# Patient Record
Sex: Female | Born: 1970 | Race: Black or African American | Hispanic: No | Marital: Single | Smoking: Current some day smoker
Health system: Southern US, Community
[De-identification: ages and names within clinical notes are randomized; demographics above are authoritative.]

## PROBLEM LIST (undated history)

## (undated) DIAGNOSIS — F419 Anxiety disorder, unspecified: Secondary | ICD-10-CM

## (undated) DIAGNOSIS — D649 Anemia, unspecified: Secondary | ICD-10-CM

## (undated) DIAGNOSIS — R001 Bradycardia, unspecified: Secondary | ICD-10-CM

## (undated) DIAGNOSIS — J0120 Acute ethmoidal sinusitis, unspecified: Secondary | ICD-10-CM

## (undated) DIAGNOSIS — R0600 Dyspnea, unspecified: Secondary | ICD-10-CM

## (undated) DIAGNOSIS — E079 Disorder of thyroid, unspecified: Secondary | ICD-10-CM

## (undated) DIAGNOSIS — E041 Nontoxic single thyroid nodule: Secondary | ICD-10-CM

## (undated) HISTORY — DX: Anemia, unspecified: D64.9

## (undated) HISTORY — DX: Nontoxic single thyroid nodule: E04.1

## (undated) HISTORY — DX: Bradycardia, unspecified: R00.1

## (undated) HISTORY — DX: Disorder of thyroid, unspecified: E07.9

## (undated) HISTORY — DX: Dyspnea, unspecified: R06.00

---

## 1997-05-31 ENCOUNTER — Inpatient Hospital Stay (HOSPITAL_COMMUNITY): Admission: AD | Admit: 1997-05-31 | Discharge: 1997-06-05 | Payer: Self-pay | Admitting: Obstetrics & Gynecology

## 1997-07-23 ENCOUNTER — Emergency Department (HOSPITAL_COMMUNITY): Admission: EM | Admit: 1997-07-23 | Discharge: 1997-07-23 | Payer: Self-pay | Admitting: Emergency Medicine

## 1997-07-23 ENCOUNTER — Inpatient Hospital Stay (HOSPITAL_COMMUNITY): Admission: AD | Admit: 1997-07-23 | Discharge: 1997-07-29 | Payer: Self-pay | Admitting: Obstetrics & Gynecology

## 1997-08-12 ENCOUNTER — Inpatient Hospital Stay (HOSPITAL_COMMUNITY): Admission: AD | Admit: 1997-08-12 | Discharge: 1997-08-12 | Payer: Self-pay | Admitting: Obstetrics & Gynecology

## 1998-07-05 ENCOUNTER — Ambulatory Visit (HOSPITAL_COMMUNITY): Admission: AD | Admit: 1998-07-05 | Discharge: 1998-07-05 | Payer: Self-pay | Admitting: *Deleted

## 1998-07-05 ENCOUNTER — Encounter: Payer: Self-pay | Admitting: *Deleted

## 2001-06-10 ENCOUNTER — Emergency Department (HOSPITAL_COMMUNITY): Admission: EM | Admit: 2001-06-10 | Discharge: 2001-06-10 | Payer: Self-pay | Admitting: Emergency Medicine

## 2001-06-10 ENCOUNTER — Encounter: Payer: Self-pay | Admitting: Emergency Medicine

## 2001-07-06 ENCOUNTER — Ambulatory Visit (HOSPITAL_COMMUNITY): Admission: RE | Admit: 2001-07-06 | Discharge: 2001-07-06 | Payer: Self-pay | Admitting: Unknown Physician Specialty

## 2001-07-06 ENCOUNTER — Encounter: Payer: Self-pay | Admitting: Unknown Physician Specialty

## 2001-11-12 ENCOUNTER — Encounter: Admission: RE | Admit: 2001-11-12 | Discharge: 2001-11-12 | Payer: Self-pay | Admitting: Oncology

## 2001-11-12 ENCOUNTER — Encounter (HOSPITAL_COMMUNITY): Admission: RE | Admit: 2001-11-12 | Discharge: 2001-12-12 | Payer: Self-pay | Admitting: Oncology

## 2001-11-13 ENCOUNTER — Encounter (HOSPITAL_COMMUNITY): Payer: Self-pay | Admitting: Oncology

## 2002-08-25 ENCOUNTER — Ambulatory Visit (HOSPITAL_COMMUNITY): Admission: RE | Admit: 2002-08-25 | Discharge: 2002-08-25 | Payer: Self-pay | Admitting: Specialist

## 2002-08-25 ENCOUNTER — Encounter: Payer: Self-pay | Admitting: Specialist

## 2004-10-15 ENCOUNTER — Emergency Department (HOSPITAL_COMMUNITY): Admission: EM | Admit: 2004-10-15 | Discharge: 2004-10-15 | Payer: Self-pay | Admitting: Emergency Medicine

## 2005-08-03 ENCOUNTER — Ambulatory Visit: Payer: Self-pay | Admitting: Internal Medicine

## 2005-10-08 ENCOUNTER — Ambulatory Visit: Payer: Self-pay | Admitting: Internal Medicine

## 2006-03-19 ENCOUNTER — Emergency Department (HOSPITAL_COMMUNITY): Admission: EM | Admit: 2006-03-19 | Discharge: 2006-03-19 | Payer: Self-pay | Admitting: Emergency Medicine

## 2010-03-05 DIAGNOSIS — R0600 Dyspnea, unspecified: Secondary | ICD-10-CM

## 2010-03-05 DIAGNOSIS — R001 Bradycardia, unspecified: Secondary | ICD-10-CM

## 2010-03-05 DIAGNOSIS — E041 Nontoxic single thyroid nodule: Secondary | ICD-10-CM

## 2010-03-05 HISTORY — DX: Bradycardia, unspecified: R00.1

## 2010-03-05 HISTORY — DX: Dyspnea, unspecified: R06.00

## 2010-03-05 HISTORY — DX: Nontoxic single thyroid nodule: E04.1

## 2010-03-26 ENCOUNTER — Encounter: Payer: Self-pay | Admitting: Family Medicine

## 2011-01-07 ENCOUNTER — Other Ambulatory Visit: Payer: Self-pay

## 2011-01-07 ENCOUNTER — Emergency Department (HOSPITAL_COMMUNITY): Payer: Self-pay

## 2011-01-07 ENCOUNTER — Encounter: Payer: Self-pay | Admitting: *Deleted

## 2011-01-07 ENCOUNTER — Emergency Department (HOSPITAL_COMMUNITY)
Admission: EM | Admit: 2011-01-07 | Discharge: 2011-01-08 | Disposition: A | Discharge status: Home / Self Care | Payer: Self-pay | Attending: Emergency Medicine | Admitting: Emergency Medicine

## 2011-01-07 DIAGNOSIS — R0602 Shortness of breath: Secondary | ICD-10-CM | POA: Insufficient documentation

## 2011-01-07 DIAGNOSIS — R070 Pain in throat: Secondary | ICD-10-CM | POA: Insufficient documentation

## 2011-01-07 DIAGNOSIS — R0789 Other chest pain: Secondary | ICD-10-CM | POA: Insufficient documentation

## 2011-01-07 DIAGNOSIS — R1310 Dysphagia, unspecified: Secondary | ICD-10-CM | POA: Insufficient documentation

## 2011-01-07 DIAGNOSIS — R0600 Dyspnea, unspecified: Secondary | ICD-10-CM

## 2011-01-07 DIAGNOSIS — R0609 Other forms of dyspnea: Secondary | ICD-10-CM | POA: Insufficient documentation

## 2011-01-07 DIAGNOSIS — M542 Cervicalgia: Secondary | ICD-10-CM | POA: Insufficient documentation

## 2011-01-07 DIAGNOSIS — E049 Nontoxic goiter, unspecified: Secondary | ICD-10-CM | POA: Insufficient documentation

## 2011-01-07 DIAGNOSIS — R0989 Other specified symptoms and signs involving the circulatory and respiratory systems: Secondary | ICD-10-CM | POA: Insufficient documentation

## 2011-01-07 DIAGNOSIS — E041 Nontoxic single thyroid nodule: Secondary | ICD-10-CM | POA: Insufficient documentation

## 2011-01-07 LAB — BASIC METABOLIC PANEL
CO2: 23 mEq/L (ref 19–32)
Calcium: 9.4 mg/dL (ref 8.4–10.5)
Creatinine, Ser: 0.68 mg/dL (ref 0.50–1.10)
GFR calc Af Amer: >90 mL/min (ref >90)
Sodium: 136 mEq/L (ref 135–145)

## 2011-01-07 LAB — DIFFERENTIAL
Basophils Relative: 0 % (ref 0–1)
Eosinophils Absolute: 0.1 10*3/uL (ref 0.0–0.7)
Eosinophils Relative: 1 % (ref 0–5)
Lymphocytes Relative: 42 % (ref 12–46)
Monocytes Absolute: 0.7 10*3/uL (ref 0.1–1.0)
Neutrophils Relative %: 50 % (ref 43–77)

## 2011-01-07 LAB — RAPID STREP SCREEN (MED CTR MEBANE ONLY): Streptococcus, Group A Screen (Direct): NEGATIVE

## 2011-01-07 LAB — CBC
Hemoglobin: 9.7 g/dL — ABNORMAL LOW (ref 12.0–15.0)
MCH: 22.1 pg — ABNORMAL LOW (ref 26.0–34.0)
Platelets: 283 10*3/uL (ref 150–400)
RBC: 4.39 MIL/uL (ref 3.87–5.11)

## 2011-01-07 LAB — D-DIMER, QUANTITATIVE: D-Dimer, Quant: 0.59 ug/mL-FEU — ABNORMAL HIGH (ref 0.00–0.48)

## 2011-01-07 MED ORDER — AEROCHAMBER PLUS W/MASK MISC
1.0000 | Freq: Once | Status: AC
  Administered 2011-01-07: 1
  Filled 2011-01-07: qty 1

## 2011-01-07 MED ORDER — ALBUTEROL SULFATE HFA 108 (90 BASE) MCG/ACT IN AERS
2.0000 | INHALATION_SPRAY | Freq: Once | RESPIRATORY_TRACT | Status: AC
  Administered 2011-01-07: 2 via RESPIRATORY_TRACT
  Filled 2011-01-07: qty 6.7

## 2011-01-07 MED ORDER — GI COCKTAIL ~~LOC~~
30.0000 mL | Freq: Once | ORAL | Status: AC
  Administered 2011-01-07: 30 mL via ORAL
  Filled 2011-01-07 (×3): qty 30

## 2011-01-07 MED ORDER — NAPROXEN 250 MG PO TABS
500.0000 mg | ORAL_TABLET | Freq: Once | ORAL | Status: AC
  Administered 2011-01-07: 500 mg via ORAL
  Filled 2011-01-07: qty 1
  Filled 2011-01-07 (×2): qty 2

## 2011-01-07 MED ORDER — IOHEXOL 300 MG/ML  SOLN
100.0000 mL | Freq: Once | INTRAMUSCULAR | Status: AC | PRN
  Administered 2011-01-07: 100 mL via INTRAVENOUS

## 2011-01-07 NOTE — ED Notes (Signed)
Reports sob for 2 weeks, denies cough or cp. spo2 100% on room air.

## 2011-01-07 NOTE — ED Provider Notes (Signed)
History     CSN: [id number] Arrival date & time: 01/07/2011  4:55 PM   First MD Initiated Contact with Patient 01/07/11 2026      Chief Complaint  Patient presents with  . Shortness of Breath    (Consider location/radiation/quality/duration/timing/severity/associated sxs/prior treatment) HPI Comments: Patient presents with one month of sore throat and worsening shortness of breath. She describes it as an inability to take a deep breath. She also expresses some chest discomfort. She states the symptoms become progressively worse over the past month and now she has some difficulty swallowing although is able to swallow everything she desires. She has no ill contacts. She has no cough, fever, chills, congestion. She has no primary care physician. Denies abdominal pain, nausea, vomiting. There no additional exacerbating or alleviating measures.  The history is provided by the patient. No language interpreter was used.    History reviewed. No pertinent past medical history.  History reviewed. No pertinent past surgical history.  History reviewed. No pertinent family history.  History  Substance Use Topics  . Smoking status: Current Everyday Smoker  . Smokeless tobacco: Not on file  . Alcohol Use: Yes     occ    OB History    Grav Para Term Preterm Abortions TAB SAB Ect Mult Living                  Review of Systems  Constitutional: Negative for fever, activity change, appetite change and fatigue.  HENT: Positive for sore throat and neck pain. Negative for congestion, rhinorrhea, trouble swallowing and neck stiffness.   Respiratory: Positive for shortness of breath. Negative for cough and chest tightness.   Cardiovascular: Positive for chest pain. Negative for palpitations.  Gastrointestinal: Negative for nausea, vomiting and abdominal pain.  Genitourinary: Negative for dysuria, urgency, frequency and flank pain.  Musculoskeletal: Negative for myalgias and back pain.    Neurological: Negative for dizziness, weakness, light-headedness and headaches.  All other systems reviewed and are negative.    Allergies  Review of patient's allergies indicates no known allergies.  Home Medications   Current Outpatient Rx  Name Route Sig Dispense Refill  . PSEUDOEPH-DOXYLAMINE-DM-APAP 60-7.08-01-998 MG/30ML PO LIQD Oral Take 30 mLs by mouth at bedtime as needed. For cough/congestion     . HYDROCODONE-ACETAMINOPHEN 7.5-500 MG/15ML PO SOLN Oral Take 15 mLs by mouth every 6 (six) hours as needed for pain. 120 mL 0    BP 112/69  Pulse 56  Temp(Src) 99 F (37.2 C) (Oral)  Resp 17  SpO2 95%  LMP 12/27/2010  Physical Exam  Nursing note and vitals reviewed. Constitutional: She is oriented to person, place, and time. She appears well-developed and well-nourished. No distress.  HENT:  Head: Normocephalic and atraumatic.  Mouth/Throat: Oropharynx is clear and moist. No oropharyngeal exudate.  Eyes: Conjunctivae and EOM are normal. Pupils are equal, round, and reactive to light.  Neck: Normal range of motion. Neck supple. Thyromegaly present.  Cardiovascular: Normal rate, regular rhythm, normal heart sounds and intact distal pulses.  Exam reveals no gallop and no friction rub.   No murmur heard. Pulmonary/Chest: Effort normal and breath sounds normal. No respiratory distress. She has no wheezes. She has no rales. She exhibits no tenderness.  Abdominal: Soft. Bowel sounds are normal. There is no tenderness.  Musculoskeletal: Normal range of motion. She exhibits no tenderness.  Neurological: She is alert and oriented to person, place, and time.  Skin: Skin is warm and dry. No rash noted.  ED Course  Procedures (including critical care time)   Date: 01/07/2011  Rate: 56  Rhythm: sinus bradycardia  QRS Axis: normal  Intervals: normal  ST/T Wave abnormalities: normal  Conduction Disutrbances:none  Narrative Interpretation:   Old EKG Reviewed: none  available  Labs Reviewed  CBC - Abnormal; Notable for the following:    Hemoglobin 9.7 (*)    HCT 32.4 (*)    MCV 73.8 (*)    MCH 22.1 (*)    MCHC 29.9 (*)    RDW 18.1 (*)    All other components within normal limits  DIFFERENTIAL - Abnormal; Notable for the following:    Lymphs Abs 4.3 (*)    All other components within normal limits  D-DIMER, QUANTITATIVE - Abnormal; Notable for the following:    D-Dimer, Quant 0.59 (*)    All other components within normal limits  BASIC METABOLIC PANEL  RAPID STREP SCREEN  I-STAT TROPONIN I   Dg Chest 2 View  01/07/2011  *RADIOLOGY REPORT*  Clinical Data:  CHEST - 2 VIEW  Comparison: 01/27/2008  Findings: Upper normal heart size.  Clear lungs.  No pneumothorax or pleural fluid.  IMPRESSION: No active cardiopulmonary disease.  Original Report Authenticated By: Donavan Burnet, M.D.   Ct Angio Chest W/cm &/or Wo Cm  01/07/2011  *RADIOLOGY REPORT*  Clinical Data:  Short of breath  CT ANGIOGRAPHY CHEST WITH CONTRAST  Technique:  Multidetector CT imaging of the chest was performed using the standard protocol during bolus administration of intravenous contrast.  Multiplanar CT image reconstructions including MIPs were obtained to evaluate the vascular anatomy.  Contrast: OMNIPAQUE IOHEXOL 300 MG/ML IV SOLN  Comparison:  None  Findings:  There are no filling defects in the pulmonary arterial tree to suggest acute pulmonary thromboembolism.  2.7 cm left thyroid mass.  Negative abnormal mediastinal adenopathy.  Negative pericardial effusion.  Lungs are clear.  No pneumothorax and no pleural effusion.  Review of the MIP images confirms the above findings.  IMPRESSION: No evidence of acute pulmonary thromboembolism.  2.7 cm left thyroid mass.  Thyroid ultrasound is recommended.  Original Report Authenticated By: Donavan Burnet, M.D.     1. Thyroid nodule   2. Dyspnea       MDM  Patient complained of throat fullness and shortness of breath has been  going on for approximately a month. I performed baseline labs as well as a chest x-ray. She is found to have anemia at 9.7 with no active bleeding. Chest x-ray was negative. She did have a slightly elevated d-dimer therefore I performed a CT angio for chest to rule out pulmonary embolus. There was an incidental finding of a 2.7 cm left thyroid mass. I feel this is likely contributing to her feeling of neck fullness however there is no compression of the airways. I discussed the case with the on-call general surgeon who recommended followup in the clinic this week. She has no insurance and limited followup I will also provide the number for the family practice clinic. Patient will require an outpatient ultrasound however do not feel there is no indication to perform this in the emergency department at this time given no airway involvement. She has lungs that are clear bilaterally with adequate air exchange. I will discharge her home with Lortab elixir as well as an albuterol inhaler.        Dayton Bailiff, MD 01/08/11 [phone]

## 2011-01-07 NOTE — ED Notes (Signed)
Patient taken to CT.

## 2011-01-08 MED ORDER — HYDROCODONE-ACETAMINOPHEN 7.5-500 MG/15ML PO SOLN: 15.0000 mL | Freq: Four times a day (QID) | ORAL | Status: DC | PRN

## 2011-01-11 ENCOUNTER — Emergency Department (HOSPITAL_COMMUNITY)
Admission: EM | Admit: 2011-01-11 | Discharge: 2011-01-11 | Disposition: A | Discharge status: Home Health | Payer: Self-pay | Attending: Emergency Medicine | Admitting: Emergency Medicine

## 2011-01-11 ENCOUNTER — Emergency Department (HOSPITAL_COMMUNITY): Payer: Self-pay

## 2011-01-11 ENCOUNTER — Encounter (HOSPITAL_COMMUNITY): Payer: Self-pay | Admitting: Emergency Medicine

## 2011-01-11 DIAGNOSIS — R0600 Dyspnea, unspecified: Secondary | ICD-10-CM

## 2011-01-11 DIAGNOSIS — R0989 Other specified symptoms and signs involving the circulatory and respiratory systems: Secondary | ICD-10-CM | POA: Insufficient documentation

## 2011-01-11 DIAGNOSIS — R0609 Other forms of dyspnea: Secondary | ICD-10-CM | POA: Insufficient documentation

## 2011-01-11 DIAGNOSIS — R070 Pain in throat: Secondary | ICD-10-CM | POA: Insufficient documentation

## 2011-01-11 DIAGNOSIS — R599 Enlarged lymph nodes, unspecified: Secondary | ICD-10-CM | POA: Insufficient documentation

## 2011-01-11 DIAGNOSIS — R0602 Shortness of breath: Secondary | ICD-10-CM | POA: Insufficient documentation

## 2011-01-11 DIAGNOSIS — E041 Nontoxic single thyroid nodule: Secondary | ICD-10-CM | POA: Insufficient documentation

## 2011-01-11 DIAGNOSIS — R42 Dizziness and giddiness: Secondary | ICD-10-CM | POA: Insufficient documentation

## 2011-01-11 MED ORDER — TRAMADOL HCL 50 MG PO TABS: ORAL_TABLET | ORAL | Status: DC

## 2011-01-11 MED ORDER — IBUPROFEN 100 MG/5ML PO SUSP: 400.0000 mg | Freq: Once | ORAL | Status: DC

## 2011-01-11 MED ORDER — TRAMADOL HCL 50 MG PO TABS
100.0000 mg | ORAL_TABLET | Freq: Once | ORAL | Status: AC
  Administered 2011-01-11: 100 mg via ORAL
  Filled 2011-01-11: qty 1

## 2011-01-11 MED ORDER — ACETAMINOPHEN 325 MG PO TABS
ORAL_TABLET | ORAL | Status: AC
  Filled 2011-01-11: qty 3

## 2011-01-11 MED ORDER — ACETAMINOPHEN 160 MG/5ML PO SOLN
975.0000 mg | Freq: Once | ORAL | Status: AC
  Administered 2011-01-11 (×2): 975 mg via ORAL

## 2011-01-11 MED ORDER — ACETAMINOPHEN 500 MG PO TABS
1000.0000 mg | ORAL_TABLET | Freq: Once | ORAL | Status: DC
  Filled 2011-01-11: qty 2

## 2011-01-11 MED ORDER — ACETAMINOPHEN 160 MG/5ML PO SOLN
ORAL | Status: AC
  Administered 2011-01-11: 975 mg via ORAL
  Filled 2011-01-11: qty 40.6

## 2011-01-11 MED ORDER — ALBUTEROL SULFATE (5 MG/ML) 0.5% IN NEBU
5.0000 mg | INHALATION_SOLUTION | Freq: Once | RESPIRATORY_TRACT | Status: AC
  Administered 2011-01-11: 5 mg via RESPIRATORY_TRACT
  Filled 2011-01-11 (×2): qty 0.5

## 2011-01-11 MED ORDER — IPRATROPIUM BROMIDE 0.02 % IN SOLN
0.5000 mg | Freq: Once | RESPIRATORY_TRACT | Status: AC
  Administered 2011-01-11: 0.5 mg via RESPIRATORY_TRACT
  Filled 2011-01-11: qty 2.5

## 2011-01-11 MED ORDER — ALBUTEROL SULFATE HFA 108 (90 BASE) MCG/ACT IN AERS: 1.0000 | INHALATION_SPRAY | Freq: Four times a day (QID) | RESPIRATORY_TRACT | Status: DC | PRN

## 2011-01-11 MED ORDER — IBUPROFEN 100 MG/5ML PO SUSP
ORAL | Status: AC
  Filled 2011-01-11: qty 30

## 2011-01-11 MED ORDER — IBUPROFEN 100 MG/5ML PO SUSP
600.0000 mg | Freq: Once | ORAL | Status: AC
  Administered 2011-01-11: 600 mg via ORAL

## 2011-01-11 NOTE — ED Provider Notes (Cosign Needed)
History     CSN: [id number] Arrival date & time: 01/11/2011  3:26 PM   First MD Initiated Contact with Patient 01/11/11 1533      Chief Complaint  Patient presents with  . Shortness of Breath    (Consider location/radiation/quality/duration/timing/severity/associated sxs/prior treatment) HPI She relates she started having shortness of breath about a month ago it was intermittent but now it is constant. She states exertion or even talking makes it worse.  She also describes some pain in her throat and points to her thyroid area. She states she feels like she has trouble breathing in and she states sometimes she makes stridorous noises or wheezing. She states sometimes she feels like her heart is racing she also feels like her chest is tight she feels dizzy and lightheaded she denies diarrhea or weight loss. She relates she was seen in the ER on November 4 and was told she had a mass on her thyroid. She has called [facility] and has an appointment for November 15. She denies any prior breathing difficulties.  History reviewed. No pertinent past medical history.  History reviewed. No pertinent past surgical history.  No family history on file. No family history of heart disease or thyroid disease  History  Substance Use Topics  . Smoking status: Current Everyday Smoker  . Smokeless tobacco: Not on file  . Alcohol Use: Yes     occ   patient works in a [employer] as a Location manager she states she's not exposed to chemicals  OB History    Grav Para Term Preterm Abortions TAB SAB Ect Mult Living                  Review of Systems  All other systems reviewed and are negative.    Allergies  Review of patient's allergies indicates no known allergies.  Home Medications   Current Outpatient Rx  Name Route Sig Dispense Refill  . OVER THE COUNTER MEDICATION  [facility] cold medicine     . PSEUDOEPH-DOXYLAMINE-DM-APAP 60-7.08-01-998 MG/30ML PO LIQD Oral Take  30 mLs by mouth at bedtime as needed. For cough/congestion     . ALBUTEROL SULFATE HFA 108 (90 BASE) MCG/ACT IN AERS Inhalation Inhale 1-2 puffs into the lungs every 6 (six) hours as needed for wheezing. 1 Inhaler 0  . TRAMADOL HCL 50 MG PO TABS  Maximum dose= 8 tablets per day 1 ot 2 tabs po QID for pain 15 tablet 0    BP 103/66  Pulse 72  Temp(Src) 97.4 F (36.3 C) (Oral)  Resp 16  SpO2 100%  LMP 12/27/2010  Physical Exam  Vitals reviewed. Constitutional: She is oriented to person, place, and time. She appears well-developed and well-nourished.       Patient has an a accent and sometimes is difficult to understand her speech  HENT:  Head: Normocephalic and atraumatic.  Mouth/Throat: Uvula is midline, oropharynx is clear and moist and mucous membranes are normal.  Eyes: Conjunctivae and EOM are normal. Pupils are equal, round, and reactive to light.  Neck: Normal range of motion. Neck supple. No JVD present. Thyromegaly present.  Cardiovascular: Normal rate, regular rhythm and normal heart sounds.   Pulmonary/Chest: Effort normal and breath sounds normal. No stridor. No respiratory distress. She has no wheezes. She has no rales. She exhibits no tenderness.       Patient's laying flat in bed with no respiratory difficulty seen. She does not appear to be dyspneic on talking. Her pulse  ox remains 100% during her exam on room air  Abdominal: Soft.  Musculoskeletal: Normal range of motion.       No edema noted  Lymphadenopathy:    She has no cervical adenopathy.  Neurological: She is alert and oriented to person, place, and time.  Skin: Skin is warm and dry.  Psychiatric: Her behavior is normal. Thought content normal.       Flat affect    ED Course  Procedures (including critical care time)  16:51 reviewed CT angio of chest from 11/4 with Dr Lowella Dandy, radiology who states her thyroid gland is visualized in it's entirety and her trachea is deviated to the right but there is no airway  compromise seen.   21:15 patient given albuterol and Atrovent nebulizer treatment. She states "it didn't help" however then she then asked me to give her another albuterol inhaler but she was given in the ER several bites ago. She is advised she should not use it so frequently. He is lying flat in bed in no respiratory distress. Her pulse ox is 100% on room air she states she feels like she can go home and she can't breathe. She is reassured that her breathing is okay and that she is feeling the thyroid lying on her trachea however it is not compressing her trachea.   Results for orders placed during the hospital encounter of 01/07/11  CBC      Component Value Range   WBC 10.2  4.0 - 10.5 (K/uL)   RBC 4.39  3.87 - 5.11 (MIL/uL)   Hemoglobin 9.7 (*) 12.0 - 15.0 (g/dL)   HCT 56.2 (*) 13.0 - 46.0 (%)   MCV 73.8 (*) 78.0 - 100.0 (fL)   MCH 22.1 (*) 26.0 - 34.0 (pg)   MCHC 29.9 (*) 30.0 - 36.0 (g/dL)   RDW 86.5 (*) 78.4 - 15.5 (%)   Platelets 283  150 - 400 (K/uL)  DIFFERENTIAL      Component Value Range   Neutrophils Relative 50  43 - 77 (%)   Lymphocytes Relative 42  12 - 46 (%)   Monocytes Relative 7  3 - 12 (%)   Eosinophils Relative 1  0 - 5 (%)   Basophils Relative 0  0 - 1 (%)   Neutro Abs 5.1  1.7 - 7.7 (K/uL)   Lymphs Abs 4.3 (*) 0.7 - 4.0 (K/uL)   Monocytes Absolute 0.7  0.1 - 1.0 (K/uL)   Eosinophils Absolute 0.1  0.0 - 0.7 (K/uL)   Basophils Absolute 0.0  0.0 - 0.1 (K/uL)   RBC Morphology TARGET CELLS     WBC Morphology ATYPICAL LYMPHOCYTES    BASIC METABOLIC PANEL      Component Value Range   Sodium 136  135 - 145 (mEq/L)   Potassium 3.6  3.5 - 5.1 (mEq/L)   Chloride 103  96 - 112 (mEq/L)   CO2 23  19 - 32 (mEq/L)   Glucose, Bld 96  70 - 99 (mg/dL)   BUN 10  6 - 23 (mg/dL)   Creatinine, Ser 6.96  0.50 - 1.10 (mg/dL)   Calcium 9.4  8.4 - 29.5 (mg/dL)   GFR calc non Af Amer >90  >90 (mL/min)   GFR calc Af Amer >90  >90 (mL/min)  RAPID STREP SCREEN      Component  Value Range   Streptococcus, Group A Screen (Direct) NEGATIVE  NEGATIVE   D-DIMER, QUANTITATIVE      Component Value Range  D-Dimer, Quant 0.59 (*) 0.00 - 0.48 (ug/mL-FEU)   Laboratory interpretation labs reviewed from 11/4. Thyroid test were sent  today but are not back.  US Soft Tissue Head/neck  01/11/2011  *RADIOLOGY REPORT*  Clinical Data: Shortness of breath, left thyroid mass  THYROID ULTRASOUND  Technique: Ultrasound examination of the thyroid gland and adjacent soft tissues was performed.  Comparison:  CT 01/07/2011  Findings:  Right thyroid lobe:  20 x 24 x 16 mm, homogeneous background parenchyma Left thyroid lobe:  26 x 30 x 68 mm Isthmus:  9 mm in thickness no  Focal nodules:  2 x 3 x 3 mm cyst, mid-right 24 x 20 x 32 mm complex mostly solid, inferior left 9 x 11 x 12 mm solid, mid-left  Lymphadenopathy:  None visualized.  IMPRESSION:  1.  Dominant 3.2 cm left thyroid lesion.  Recommend outpatient FNA biopsy to exclude neoplasm. This recommendation follows the consensus statement:  Management of Thyroid Nodules Detected as [place]: Society of Radiologists in Ultrasound Consensus Conference Statement.  Radiology 2005; [id number].  Available online at : [email or web address].[email or web address]/[email or web address].  Original Report Authenticated By: Osa Craver, M.D.      Dg Chest 2 View  01/07/2011  *RADIOLOGY REPORT*  Clinical Data:  CHEST - 2 VIEW  Comparison: 01/27/2008  Findings: Upper normal heart size.  Clear lungs.  No pneumothorax or pleural fluid.  IMPRESSION: No active cardiopulmonary disease.  Original Report Authenticated By: Donavan Burnet, M.D.   Ct Angio Chest W/cm &/or Wo Cm  01/07/2011  *RADIOLOGY REPORT*  Clinical Data:  Short of breath  CT ANGIOGRAPHY CHEST WITH CONTRAST  Technique:  Multidetector CT imaging of the chest was performed using the standard protocol during bolus administration of intravenous contrast.  Multiplanar CT image reconstructions including MIPs  were obtained to evaluate the vascular anatomy.  Contrast: OMNIPAQUE IOHEXOL 300 MG/ML IV SOLN  Comparison:  None  Findings:  There are no filling defects in the pulmonary arterial tree to suggest acute pulmonary thromboembolism.  2.7 cm left thyroid mass.  Negative abnormal mediastinal adenopathy.  Negative pericardial effusion.  Lungs are clear.  No pneumothorax and no pleural effusion.  Review of the MIP images confirms the above findings.  IMPRESSION: No evidence of acute pulmonary thromboembolism.  2.7 cm left thyroid mass.  Thyroid ultrasound is recommended.  Original Report Authenticated By: Donavan Burnet, M.D.      Diagnoses that have been ruled out:  Diagnoses that are still under consideration:  Final diagnoses:  Thyroid nodule  Dyspnea    Medications  OVER THE COUNTER MEDICATION (not administered)  ibuprofen (ADVIL,MOTRIN) 100 MG/5ML suspension (not administered)  albuterol (PROVENTIL HFA;VENTOLIN HFA) 108 (90 BASE) MCG/ACT inhaler (not administered)  traMADol (ULTRAM) 50 MG tablet (not administered)  traMADol (ULTRAM) tablet 100 mg (100 mg Oral Given 01/11/11 1700)  albuterol (PROVENTIL) (5 MG/ML) 0.5% nebulizer solution 5 mg (5 mg Nebulization Given 01/11/11 1809)  ipratropium (ATROVENT) nebulizer solution 0.5 mg (0.5 mg Nebulization Given 01/11/11 1810)  acetaminophen (TYLENOL) solution 975 mg (975 mg Oral Given 01/11/11 1845)  ibuprofen (ADVIL,MOTRIN) 100 MG/5ML suspension 600 mg (600 mg Oral Given 01/11/11 2117)   Devoria Albe, MD, FACEP    MDM          Ward Givens, MD 01/11/11 2127

## 2011-01-11 NOTE — ED Notes (Signed)
Pt returns from ultrasound. Pt asks for inhaler.Pt has BBS=Clear with a good air exchange. SAo2 is 100% on room air and rr is 13

## 2011-01-11 NOTE — ED Notes (Signed)
Pt c/o airway obstruction.  SpO2=100% on room air.  Pt does in fact have a palpable thyroid.  No respiratory distress noted.  VSS, pt does have asymptomatic bradycardia.

## 2011-01-11 NOTE — ED Notes (Signed)
Was seen Sunday for same  Not feeling any better sob  Given inhaler for sob

## 2011-01-11 NOTE — ED Notes (Signed)
Pt report given to [facility], RN and care endorsed

## 2011-01-13 ENCOUNTER — Encounter (HOSPITAL_COMMUNITY): Payer: Self-pay | Admitting: *Deleted

## 2011-01-13 ENCOUNTER — Emergency Department (HOSPITAL_COMMUNITY)
Admission: EM | Admit: 2011-01-13 | Discharge: 2011-01-13 | Disposition: A | Discharge status: Home / Self Care | Payer: Self-pay | Attending: Emergency Medicine | Admitting: Emergency Medicine

## 2011-01-13 DIAGNOSIS — E860 Dehydration: Secondary | ICD-10-CM | POA: Insufficient documentation

## 2011-01-13 DIAGNOSIS — R0602 Shortness of breath: Secondary | ICD-10-CM | POA: Insufficient documentation

## 2011-01-13 DIAGNOSIS — F419 Anxiety disorder, unspecified: Secondary | ICD-10-CM

## 2011-01-13 DIAGNOSIS — F411 Generalized anxiety disorder: Secondary | ICD-10-CM | POA: Insufficient documentation

## 2011-01-13 DIAGNOSIS — E079 Disorder of thyroid, unspecified: Secondary | ICD-10-CM | POA: Insufficient documentation

## 2011-01-13 DIAGNOSIS — R42 Dizziness and giddiness: Secondary | ICD-10-CM | POA: Insufficient documentation

## 2011-01-13 LAB — POCT I-STAT, CHEM 8
BUN: 5 mg/dL — ABNORMAL LOW (ref 6–23)
Calcium, Ion: 1.28 mmol/L (ref 1.12–1.32)
Chloride: 104 mEq/L (ref 96–112)
Glucose, Bld: 94 mg/dL (ref 70–99)
TCO2: 23 mmol/L (ref 0–100)

## 2011-01-13 MED ORDER — LORAZEPAM 2 MG/ML IJ SOLN
2.0000 mg | Freq: Once | INTRAMUSCULAR | Status: AC
  Administered 2011-01-13: 2 mg via INTRAVENOUS
  Filled 2011-01-13: qty 1

## 2011-01-13 MED ORDER — SODIUM CHLORIDE 0.9 % IJ SOLN
3.0000 mL | INTRAMUSCULAR | Status: DC | PRN
  Administered 2011-01-13: 3 mL via INTRAVENOUS

## 2011-01-13 MED ORDER — LORAZEPAM 1 MG PO TABS: 1.0000 mg | ORAL_TABLET | Freq: Four times a day (QID) | ORAL | Status: DC | PRN

## 2011-01-13 MED ORDER — SODIUM CHLORIDE 0.9 % IV BOLUS (SEPSIS)
2000.0000 mL | Freq: Once | INTRAVENOUS | Status: AC
  Administered 2011-01-13: 2000 mL via INTRAVENOUS

## 2011-01-13 MED ORDER — OXYCODONE-ACETAMINOPHEN 5-325 MG PO TABS: 2.0000 | ORAL_TABLET | ORAL | Status: AC | PRN

## 2011-01-13 NOTE — ED Provider Notes (Signed)
History     CSN: [id number] Arrival date & time: 01/13/2011  2:33 PM   First MD Initiated Contact with Patient 01/13/11 1740      Chief Complaint  Patient presents with  . Emesis  . Shortness of Breath    (Consider location/radiation/quality/duration/timing/severity/associated sxs/prior treatment) HPI This 40 year old female has a one-month history of a neck mass which is her feeling of dysphasia or odynophagia, and shortness of breath as if she will stop breathing if she lays back supine. She is no fever cough headache altered mental status focal neurologic symptoms stridor drooling or chest pain wheezing abdominal pain. When she swallows food she does gag some of it back up but is able to handle her secretions and swallow fluids. She was seen 2 days ago with unremarkable labs and ultrasound of the neck showing a thyroid mass and she has ENT followup as scheduled 5 days from now. She also recently had a CT scan of the chest revealing no evidence of pulmonary embolism or airway compromise for same symptoms. She now returns today feeling dehydrated with a dry mouth and lightheadedness and generalized weakness and she has not been drinking enough fluids over the last 2 days. She is also quite anxious and although she is not suicidal homicidal hallucinating, she would like something to help her relax she can sleep. History reviewed. No pertinent past medical history.  History reviewed. No pertinent past surgical history.  History reviewed. No pertinent family history.  History  Substance Use Topics  . Smoking status: Current Everyday Smoker  . Smokeless tobacco: Not on file  . Alcohol Use: Yes     occ    OB History    Grav Para Term Preterm Abortions TAB SAB Ect Mult Living                  Review of Systems  Constitutional: Negative for fever.       10 Systems reviewed and are negative for acute change except as noted in the HPI.  HENT: Negative for congestion.   Eyes:  Negative for discharge and redness.  Respiratory: Negative for cough, choking and shortness of breath.   Cardiovascular: Negative for chest pain and leg swelling.  Gastrointestinal: Negative for vomiting and abdominal pain.  Musculoskeletal: Negative for back pain.  Skin: Negative for rash.  Neurological: Positive for light-headedness. Negative for syncope, numbness and headaches.  Psychiatric/Behavioral: Negative for suicidal ideas and hallucinations. The patient is nervous/anxious.        No behavior change.    Allergies  Review of patient's allergies indicates no known allergies.  Home Medications   Current Outpatient Rx  Name Route Sig Dispense Refill  . ALBUTEROL SULFATE HFA 108 (90 BASE) MCG/ACT IN AERS Inhalation Inhale 1-2 puffs into the lungs every 6 (six) hours as needed for wheezing. 1 Inhaler 0  . LORAZEPAM 1 MG PO TABS Oral Take 1 tablet (1 mg total) by mouth every 6 (six) hours as needed for anxiety. 10 tablet 0  . OVER THE COUNTER MEDICATION  [facility] cold medicine     . OXYCODONE-ACETAMINOPHEN 5-325 MG PO TABS Oral Take 2 tablets by mouth every 4 (four) hours as needed for pain. 6 tablet 0  . PSEUDOEPH-DOXYLAMINE-DM-APAP 60-7.08-01-998 MG/30ML PO LIQD Oral Take 30 mLs by mouth at bedtime as needed. For cough/congestion     . TRAMADOL HCL 50 MG PO TABS  Maximum dose= 8 tablets per day 1 ot 2 tabs po QID for pain 15 tablet  0    BP 118/64  Pulse 62  Temp(Src) 98.2 F (36.8 C) (Oral)  Resp 16  SpO2 100%  LMP 12/27/2010  Physical Exam  Nursing note and vitals reviewed. Constitutional:       Awake, alert, nontoxic appearance.  HENT:  Head: Atraumatic.  Mouth/Throat: Oropharynx is clear and moist.  Eyes: Conjunctivae are normal. Pupils are equal, round, and reactive to light. Right eye exhibits no discharge. Left eye exhibits no discharge.  Neck: Normal range of motion. Neck supple. No JVD present. No tracheal deviation present. Thyromegaly present.    Cardiovascular: Normal rate and regular rhythm.   No murmur heard. Pulmonary/Chest: Effort normal and breath sounds normal. No stridor. No respiratory distress. She has no wheezes. She has no rales. She exhibits no tenderness.  Abdominal: Soft. Bowel sounds are normal. There is no tenderness. There is no rebound.  Musculoskeletal: Normal range of motion. She exhibits no edema and no tenderness.       Baseline ROM, no obvious new focal weakness.  Lymphadenopathy:    She has no cervical adenopathy.  Neurological:       Mental status and motor strength appears baseline for patient and situation.  Skin: No rash noted.  Psychiatric:       Anxious    ED Course  Procedures (including critical care time) The patient feels much better she is calm and relaxed in the ED without breathing difficulty with normal pulse asymmetry in room air. There is no airway compromise at this time.  Patient / Family / Caregiver informed of clinical course, understand medical decision-making process, and agree with plan. Labs Reviewed  POCT I-STAT, CHEM 8 - Abnormal; Notable for the following:    BUN 5 (*)    Hemoglobin 11.2 (*)    HCT 33.0 (*)    All other components within normal limits  LAB REPORT - SCANNED   No results found.   1. Dehydration   2. Anxiety   3. Thyroid mass       MDM  I doubt any other EMC precluding discharge at this time including, but not necessarily limited to the following:airway compromise, SBI.        Hurman Horn, MD 01/14/11 [phone]

## 2011-01-13 NOTE — ED Notes (Signed)
Pt presenting to ed with c/o being told she had a nodule on her throat pt states nodule is growing and causing her difficulty with eating and swallowing. Pt states she was told that something is wrong with her thyroid. Pt is alert and oriented at this time. Pt is on monitor with continuous pulse ox. Pt is in nad at this time airway is intact.

## 2011-01-18 ENCOUNTER — Ambulatory Visit (INDEPENDENT_AMBULATORY_CARE_PROVIDER_SITE_OTHER): Payer: Self-pay | Admitting: General Surgery

## 2011-01-18 ENCOUNTER — Encounter (INDEPENDENT_AMBULATORY_CARE_PROVIDER_SITE_OTHER): Payer: Self-pay | Admitting: General Surgery

## 2011-01-18 VITALS — BP 98/68 | HR 62 | Temp 97.4°F | Resp 16 | Ht 67.0 in | Wt 181.8 lb

## 2011-01-18 DIAGNOSIS — E079 Disorder of thyroid, unspecified: Secondary | ICD-10-CM

## 2011-01-18 NOTE — Progress Notes (Signed)
Patient ID: Jessica Shea, female   DOB: 30-Apr-1970, 40 y.o.   MRN: [id number]  Chief Complaint  Patient presents with  . Thyroid Nodule    Left thyroid nodule     HPI Jessica Shea is a 40 y.o. female.  This patient is referred from Dr. Lynelle Doctor at the emergency room for evaluation of a left thyroid nodule found on a CT angiogram of the chest to evaluate for breathing difficulties. She states that she has had difficulty breathing for approximately one month and has been seen in the emergency room 3 times for this. She is CT angiogram to evaluate for pulmonary embolus which demonstrated a 2.7 cm left thyroid mass an ultrasound was recommended. Ultrasound then demonstrated a left thyroid nodule measuring 3.2 cm with some mostly solid components but some cystic as well. She has had multiple other symptoms such as fainting and dizziness and she's been treated with inhalers with no relief. She has some swallowing problems where she feels like things are getting stuck in her neck and that she has to swallow twice to get food and even liquids down. She states that her voice has deepened and she has no some hair loss and palpitations but denies any weight loss or weight gain she also denies any history of radiation. She denies any family history of thyroid cancer or thyroid problems or any pancreas, pituitary or other endocrine problems. HPI  Past Medical History  Diagnosis Date  . Thyroid disease   . Left thyroid nodule 2012  . Sinus bradycardia 2012  . Anemia   . Dyspnea 2012    No past surgical history on file.  No family history on file.  Social History History  Substance Use Topics  . Smoking status: Current Everyday Smoker  . Smokeless tobacco: Not on file  . Alcohol Use: Yes     occ    No Known Allergies  Current Outpatient Prescriptions  Medication Sig Dispense Refill  . albuterol (PROVENTIL HFA;VENTOLIN HFA) 108 (90 BASE) MCG/ACT inhaler Inhale 1-2 puffs into the lungs every 6  (six) hours as needed for wheezing.  1 Inhaler  0  . LORazepam (ATIVAN) 1 MG tablet Take 1 tablet (1 mg total) by mouth every 6 (six) hours as needed for anxiety.  10 tablet  0  . OVER THE COUNTER MEDICATION [facility] cold medicine       . oxyCODONE-acetaminophen (PERCOCET) 5-325 MG per tablet Take 2 tablets by mouth every 4 (four) hours as needed for pain.  6 tablet  0  . Pseudoeph-Doxylamine-DM-APAP (NYQUIL) 60-7.08-01-998 MG/30ML LIQD Take 30 mLs by mouth at bedtime as needed. For cough/congestion       . traMADol (ULTRAM) 50 MG tablet Maximum dose= 8 tablets per day 1 ot 2 tabs po QID for pain  15 tablet  0    Review of Systems Review of Systems All other review of systems negative or noncontributory except as stated in the HPI  Blood pressure 98/68, pulse 62, temperature 97.4 F (36.3 C), temperature source Temporal, resp. rate 16, height 5\' 7"  (1.702 m), weight 181 lb 12.8 oz (82.464 kg), last menstrual period 12/27/2010.  Physical Exam Physical Exam  Vitals reviewed. Constitutional: She is oriented to person, place, and time. She appears well-developed and well-nourished. No distress.  HENT:  Head: Normocephalic and atraumatic.  Mouth/Throat: No oropharyngeal exudate.  Eyes: Conjunctivae and EOM are normal. Pupils are equal, round, and reactive to light. No scleral icterus.  Neck: Normal range of motion. No  tracheal deviation present. Thyromegaly present.       Palpable left thyroid nodule with no visible enlargement, no LAD, no stridor   Cardiovascular: Normal rate, regular rhythm and normal heart sounds.   Pulmonary/Chest: Effort normal and breath sounds normal. No stridor. No respiratory distress. She has no wheezes. She has no rales.  Abdominal: Soft. Bowel sounds are normal. She exhibits no distension.  Musculoskeletal: Normal range of motion. She exhibits no edema.  Lymphadenopathy:    She has no cervical adenopathy.  Neurological: She is alert and oriented to person,  place, and time.  Skin: Skin is warm and dry. No rash noted. She is not diaphoretic. No erythema. No pallor.  Psychiatric: Her behavior is normal. Judgment and thought content normal.       Flat affect     Data Reviewed Ct, [place]  Assessment    Solitary left thyroid nodule with questionable symptoms.  I think that some of her symptoms could be related to the thyroid nodule such as the dysphasia and some compressive symptoms in her throat but I doubt that her shortness of breath is due entirely to the thyroid nodule. I have recommended FNA of thyroid nodule for further evaluation. I think that even if this is benign and that we would most likely proceed with at least hemi-thyroidectomy due to her symptoms. However if this is malignant we would proceed with total thyroidectomy. She has multiple symptoms which are not likely related to her thyroid nodule. Her thyroid function tests are normal. She had interesting affect during her visit. She was very flat and almost care free about the thyroid nodule but was more concerned about her other constellation of symptoms. I recommended that she find a primary physician to manage these other issues that are not likely related to her thyroid.She had no evidence of any distress or respiratory problems on my exam today. Even though she stated that she was having difficulty breathing during our visit, she had normal respiratory rate her lungs sounded clear and was not in any labored breathing.Some of her symptoms may be due to anxiety.    Plan    Set her up for FNA of the left thyroid nodule and we'll see her back after the biopsy results to discuss surgical options. I do think that she needs to see a primary care provider to manage her other medical problems which are unrelated to the thyroid.       Lodema Pilot DAVID 01/18/2011, 5:29 PM

## 2011-01-20 ENCOUNTER — Emergency Department (HOSPITAL_COMMUNITY)
Admission: EM | Admit: 2011-01-20 | Discharge: 2011-01-21 | Disposition: A | Discharge status: Home / Self Care | Payer: Self-pay | Attending: Emergency Medicine | Admitting: Emergency Medicine

## 2011-01-20 ENCOUNTER — Encounter (HOSPITAL_COMMUNITY): Payer: Self-pay | Admitting: Emergency Medicine

## 2011-01-20 DIAGNOSIS — R0602 Shortness of breath: Secondary | ICD-10-CM | POA: Insufficient documentation

## 2011-01-20 DIAGNOSIS — E041 Nontoxic single thyroid nodule: Secondary | ICD-10-CM | POA: Insufficient documentation

## 2011-01-20 DIAGNOSIS — R42 Dizziness and giddiness: Secondary | ICD-10-CM | POA: Insufficient documentation

## 2011-01-20 DIAGNOSIS — F419 Anxiety disorder, unspecified: Secondary | ICD-10-CM

## 2011-01-20 DIAGNOSIS — F411 Generalized anxiety disorder: Secondary | ICD-10-CM | POA: Insufficient documentation

## 2011-01-20 DIAGNOSIS — R079 Chest pain, unspecified: Secondary | ICD-10-CM | POA: Insufficient documentation

## 2011-01-20 MED ORDER — ONDANSETRON 8 MG PO TBDP
8.0000 mg | ORAL_TABLET | Freq: Once | ORAL | Status: AC
Start: 2011-01-20 — End: 2011-01-20
  Administered 2011-01-20: 8 mg via ORAL
  Filled 2011-01-20: qty 1

## 2011-01-20 MED ORDER — LORAZEPAM 1 MG PO TABS
1.0000 mg | ORAL_TABLET | Freq: Once | ORAL | Status: AC
  Administered 2011-01-20: 1 mg via ORAL
  Filled 2011-01-20: qty 1

## 2011-01-20 MED ORDER — LORAZEPAM 1 MG PO TABS: 1.0000 mg | ORAL_TABLET | Freq: Three times a day (TID) | ORAL | Status: AC | PRN

## 2011-01-20 MED ORDER — HYDROCODONE-ACETAMINOPHEN 7.5-325 MG/15ML PO SOLN: 15.0000 mL | Freq: Four times a day (QID) | ORAL | Status: AC | PRN

## 2011-01-20 MED ORDER — ONDANSETRON HCL 4 MG PO TABS: 4.0000 mg | ORAL_TABLET | Freq: Four times a day (QID) | ORAL | Status: AC

## 2011-01-20 NOTE — ED Notes (Signed)
Pt presented to  The ER with c/o Shortness of breath, started 2 months ago, facial swelling today, after taking some Aleve and nightquil, pt also states that she has difficulty with swallowing, did not eat nothing for 5 days, however, in further explanation, states that she is able to take some crushed pills or crackers, and that she has Hx thyroid problem, reports nodules. At [facility] time no respiratory distress noted, pt able to speak in clear and full sentences. Also pt c/o chills and fever, but states did not measure.

## 2011-01-20 NOTE — ED Provider Notes (Signed)
History     CSN: [id number] Arrival date & time: 01/20/2011  7:18 PM   First MD Initiated Contact with Patient 01/20/11 2038      Chief Complaint  Patient presents with  . Facial Swelling  . Shortness of Breath  . Dysphagia  . Dizziness  . Chest Pain   HPI: Patient is a 40 y.o. female presenting with shortness of breath and chest pain. The history is provided by the patient. No language interpreter was used.  Shortness of Breath  The current episode started more than 2 weeks ago. The problem occurs continuously. The problem has been unchanged. The problem is mild. The symptoms are relieved by nothing. Associated symptoms include shortness of breath and wheezing. Pertinent negatives include no chest pain and no stridor.  Chest Pain Primary symptoms include shortness of breath and wheezing.   Reports persistent SOB and  difficulty swallowing. This is pt's 4th visit for same symptoms. She has been dx'd by Ct to have a thyroid nodule and was referred to surgery. She saw the surgeon recently and was told they were arranging a biopsy of her nodule. Pt states that she has not heard back form them and does not know what to do. Though pt states she unable to swallow or breathe she is clearly managing her own secretions and in No RESP distress. Requesting another inhlaer, though BBS are CTA. Pt very anxious and holding throat. States when she east she gets nauseated. States has run out of Zofran and Ativan recently prescribed.  Past Medical History  Diagnosis Date  . Thyroid disease   . Left thyroid nodule 2012  . Sinus bradycardia 2012  . Anemia   . Dyspnea 2012    History reviewed. No pertinent past surgical history.  History reviewed. No pertinent family history.  History  Substance Use Topics  . Smoking status: Current Everyday Smoker  . Smokeless tobacco: Not on file  . Alcohol Use: Yes     occ    OB History    Grav Para Term Preterm Abortions TAB SAB Ect Mult Living             Review of Systems  Constitutional: Negative.   HENT: Negative.   Eyes: Negative.   Respiratory: Positive for shortness of breath and wheezing. Negative for stridor.   Cardiovascular: Negative.  Negative for chest pain.  Gastrointestinal: Negative.   Genitourinary: Negative.   Musculoskeletal: Negative.   Skin: Negative.   Neurological: Negative.   Hematological: Negative.   Psychiatric/Behavioral: Negative.     Allergies  Review of patient's allergies indicates no known allergies.  Home Medications   Current Outpatient Rx  Name Route Sig Dispense Refill  . ALBUTEROL SULFATE HFA 108 (90 BASE) MCG/ACT IN AERS Inhalation Inhale 1-2 puffs into the lungs every 6 (six) hours as needed for wheezing. 1 Inhaler 0  . LORAZEPAM 1 MG PO TABS Oral Take 660 mg by mouth every 6 (six) hours as needed. Pain.     Marland Kitchen NAPROXEN SODIUM 220 MG PO TABS Oral Take 660 mg by mouth daily as needed.      . OXYCODONE-ACETAMINOPHEN 5-325 MG PO TABS Oral Take 2 tablets by mouth every 4 (four) hours as needed for pain. 6 tablet 0  . PSEUDOEPH-DOXYLAMINE-DM-APAP 60-7.08-01-998 MG/30ML PO LIQD Oral Take 30 mLs by mouth at bedtime as needed. For cough/congestion     . OMEGA-3 FATTY ACIDS 1000 MG PO CAPS Oral Take 1 g by mouth daily.  BP 124/64  Pulse 50  Temp(Src) 98.5 F (36.9 C) (Oral)  Resp 16  Wt 180 lb (81.647 kg)  SpO2 100%  LMP 12/27/2010  Physical Exam  Constitutional: She is oriented to person, place, and time. She appears well-developed and well-nourished.  HENT:  Head: Normocephalic and atraumatic.  Eyes: Conjunctivae are normal.  Neck: Normal range of motion. Thyromegaly present.  Cardiovascular: Normal rate and regular rhythm.   Pulmonary/Chest: Effort normal and breath sounds normal.  Abdominal: Soft. Bowel sounds are normal.  Musculoskeletal: Normal range of motion.  Neurological: She is alert and oriented to person, place, and time. She has normal reflexes.  Skin:  Skin is warm and dry.  Psychiatric:       Very anxious    ED Course  Procedures  Pt states feels some better after Ativan and Zofran. Discussed w/ patient that I reviewed the note from her visit w/ Dr. Biagio Quint recently in the his office. Patient now understands to Dr. Biagio Quint is in the process of ordering a fine needle biopsy of her thyroid nodule. Suggested patient called the office on Monday to attempt to find out if that biopsy has been scheduled and for further instructions. We'll plan to discharge patient home with more Zofran short course of Ativan a short course of liquid pain medicine and encourage her to continue to follow up with scheduling of outpatient procedures. Patient agreeable with plan   Labs Reviewed - No data to display No results found.   No diagnosis found.    MDM  No signs or symptoms of acute respiratory distress. The patient obviously managing her own secretions and able to tolerate by mouth fluids. Suspect anxiety related to new diagnosis of thyroid nodule.        Leanne Chang, NP 01/20/11 2348  Roma Kayser Tu Bayle, NP 01/20/11 2350

## 2011-01-20 NOTE — ED Notes (Signed)
Pt requesting IV pain medication. Pt states this help her on previous visits. Pt requesting breathing tx for cough, lungs clear in all fields. Will continue to monitor.

## 2011-01-21 NOTE — ED Provider Notes (Signed)
Medical screening examination/treatment/procedure(s) were performed by non-physician practitioner and as supervising physician I was immediately available for consultation/collaboration.  Sunil Hue, MD 01/21/11 0112 

## 2011-01-23 ENCOUNTER — Ambulatory Visit
Admission: RE | Admit: 2011-01-23 | Discharge: 2011-01-23 | Disposition: A | Discharge status: Home / Self Care | Payer: No Typology Code available for payment source | Source: Ambulatory Visit | Attending: General Surgery | Admitting: General Surgery

## 2011-01-23 ENCOUNTER — Other Ambulatory Visit (HOSPITAL_COMMUNITY)
Admission: RE | Admit: 2011-01-23 | Discharge: 2011-01-23 | Disposition: A | Discharge status: Home / Self Care | Payer: Self-pay | Source: Ambulatory Visit | Attending: Interventional Radiology | Admitting: Interventional Radiology

## 2011-01-23 DIAGNOSIS — E079 Disorder of thyroid, unspecified: Secondary | ICD-10-CM

## 2011-01-23 DIAGNOSIS — E049 Nontoxic goiter, unspecified: Secondary | ICD-10-CM | POA: Insufficient documentation

## 2011-01-31 ENCOUNTER — Telehealth (INDEPENDENT_AMBULATORY_CARE_PROVIDER_SITE_OTHER): Payer: Self-pay

## 2011-01-31 NOTE — Telephone Encounter (Signed)
Returned patient call, unable to speak with Jessica Shea, left voice mail to call our office 8:30-5:00 for results.

## 2011-02-01 ENCOUNTER — Telehealth (INDEPENDENT_AMBULATORY_CARE_PROVIDER_SITE_OTHER): Payer: Self-pay

## 2011-02-01 NOTE — Telephone Encounter (Signed)
Called patient with her [place] and Thyroid BX results, also made sure patient is aware of her follow up appointment with Dr. Biagio Quint 02/02/11 @ 10:30 to discuss treatment options and her results in more detail.

## 2011-02-02 ENCOUNTER — Encounter (INDEPENDENT_AMBULATORY_CARE_PROVIDER_SITE_OTHER): Payer: Self-pay | Admitting: General Surgery

## 2011-02-02 ENCOUNTER — Ambulatory Visit (INDEPENDENT_AMBULATORY_CARE_PROVIDER_SITE_OTHER): Payer: Self-pay | Admitting: General Surgery

## 2011-02-02 VITALS — BP 118/72 | HR 60 | Temp 97.2°F | Resp 16 | Ht 66.0 in | Wt 181.0 lb

## 2011-02-02 DIAGNOSIS — E041 Nontoxic single thyroid nodule: Secondary | ICD-10-CM

## 2011-02-02 NOTE — Progress Notes (Signed)
Subjective:     Patient ID: Jessica Shea, female   DOB: [birth year], 40 y.o.   MRN: [id number]  HPI I reviewed the pathology with this patient which was consistent with a benign hyperplastic thyroid nodule. No evidence of malignancy was identified. The patient states that she still has some symptoms of dysphagia and scratchiness in her throat and occasionally feels like food is getting caught in her throat. However, most of her complaints are of anxiety and difficulty breathing. Otherwise no changes from prior exam and history.  Review of Systems     Objective:   Physical Exam No acute distress and nontoxic-appearing  Left thyroid is mildly prominent but no suspicious adenopathy was identified.    Assessment:     Left thyroid nodule The pathology was consistent with a benign hyperplastic nodule and there was no evidence of malignancy in the biopsy. She has some symptoms which certainly could be attributed to a thyroid mass however the majority of her symptoms for which she presents and is complaining of our unlikely due to her left thyroid mass. Her thyroid function tests were normal and the pathology was benign. I did offer her left hemithyroidectomy for treatment of symptoms. However, I think it is reasonable to repeat ultrasound and FNA in 6 months to violate for any growth or in case this biopsy was a false negative. Certainly if she becomes more symptomatic than we could proceed with hemithyroidectomy sooner than that. I again recommended a primary physician to assist with evaluation of her anxiety complaints and other issues which may arise. She is requesting a refill of her abdomen which was given to her by the emergency room physician and I explained that I did not feel comfortable refilling this prescription and that she needs to establish herself with the primary physician. We provided her with the names of some physicians for evaluation.    Plan:     She will establish herself with  her primary physician See her back in 6 months with repeat ultrasound and FNA biopsy of a left thyroid mass We will consider left hemithyroidectomy sooner than that if symptoms increase.

## 2011-06-13 ENCOUNTER — Encounter (INDEPENDENT_AMBULATORY_CARE_PROVIDER_SITE_OTHER): Payer: Self-pay | Admitting: General Surgery

## 2013-11-02 IMAGING — CT CT ANGIO CHEST
2 of 6 series · 19 of 46 positions shown · IV contrast (APPLIED)
Comparison: None

CLINICAL DATA: Short of breath

CT ANGIOGRAPHY CHEST WITH CONTRAST
TECHNIQUE: Multidetector CT imaging of the chest was performed
using the standard protocol during bolus administration of
intravenous contrast.  Multiplanar CT image reconstructions
including MIPs were obtained to evaluate the vascular anatomy.
Contrast: 100mL OMNIPAQUE IOHEXOL 300 MG/ML IV SOLN

[Series 8: pulm embolism 1.0 b25f thin · axial · 0.63mm/px · z∈[-226,+66]mm · 16 of 320 slices shown]
[im 14/320  lung]
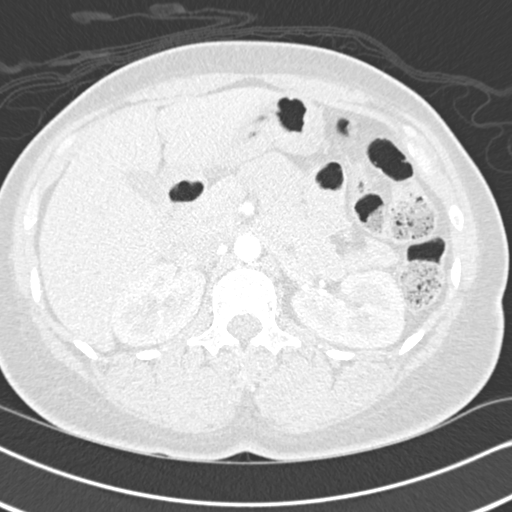
[im 42/320  soft-tissue]
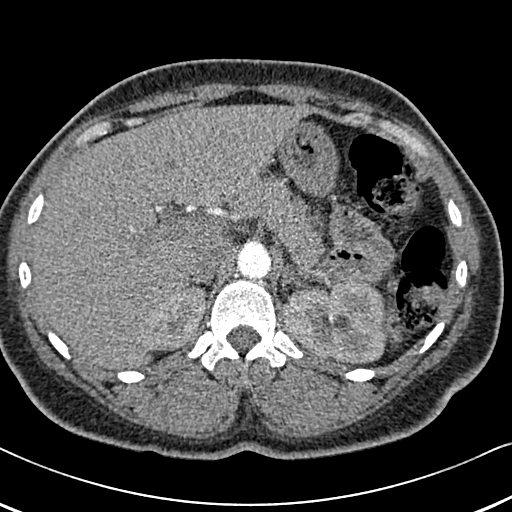
[im 56/320  lung]
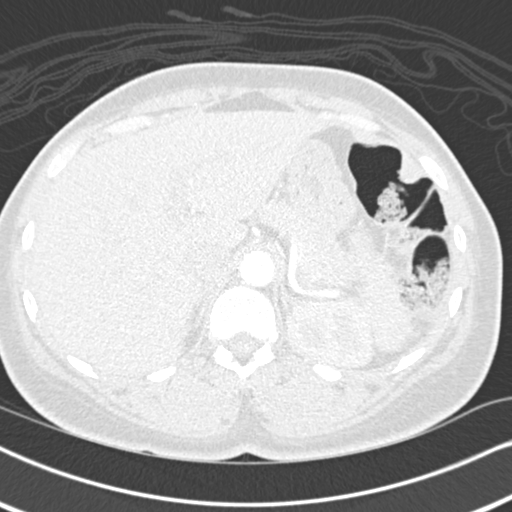
[im 70/320  soft-tissue]
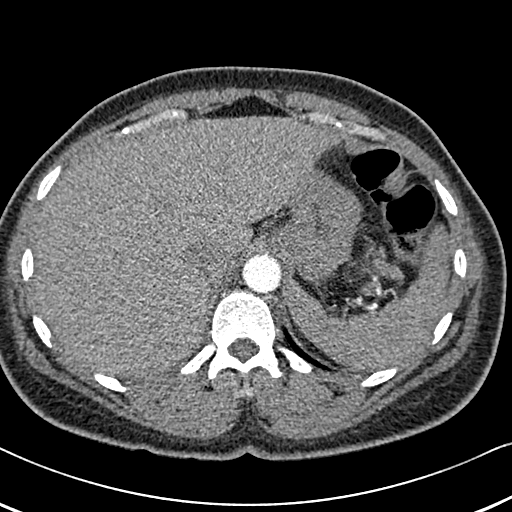
[im 98/320  lung]
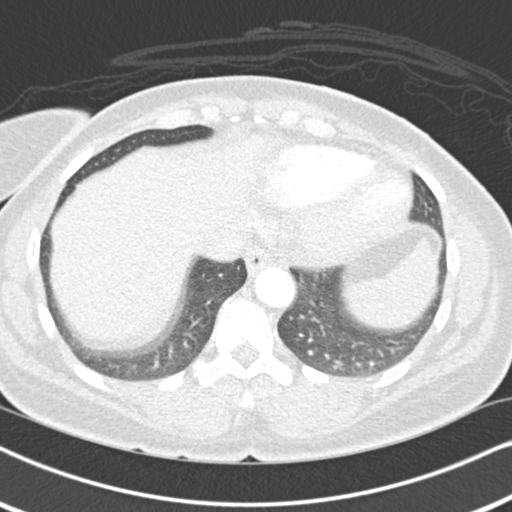
[im 111/320  soft-tissue]
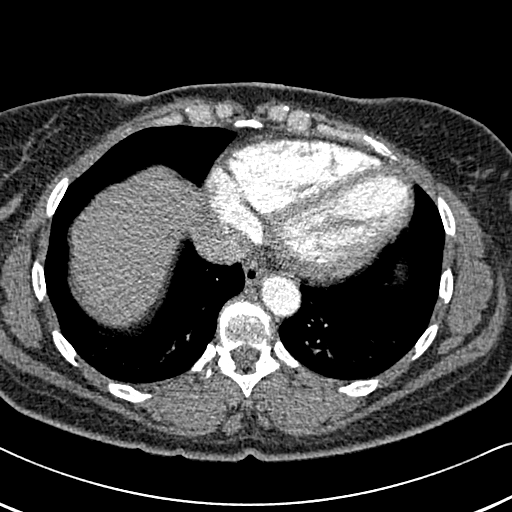
[im 125/320  lung]
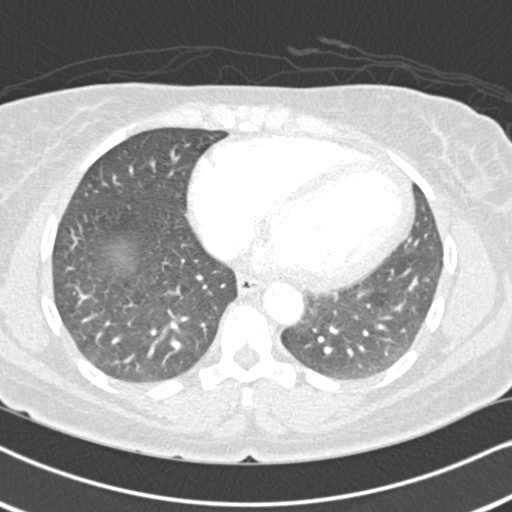
[im 153/320  soft-tissue]
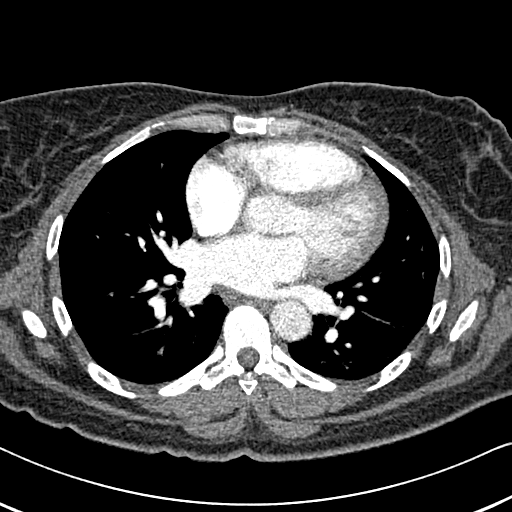
[im 167/320  lung]
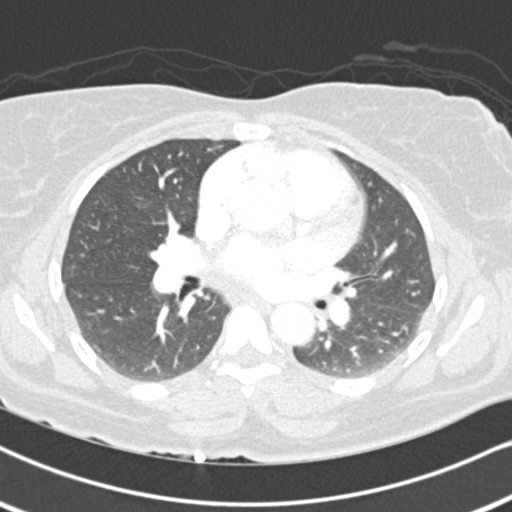
[im 195/320  soft-tissue]
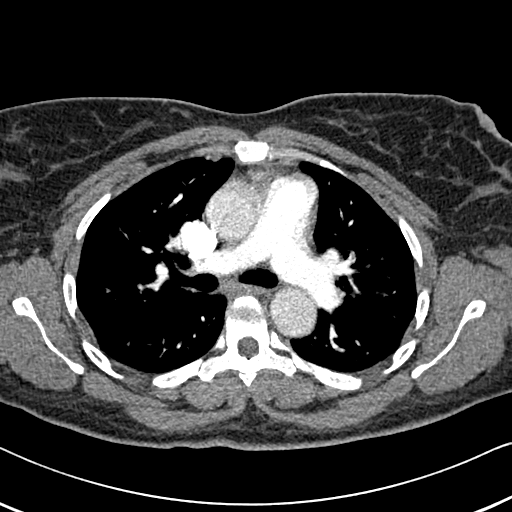
[im 209/320  lung]
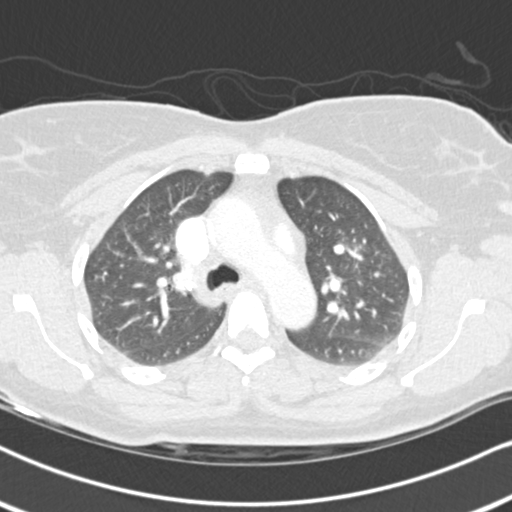
[im 222/320  soft-tissue]
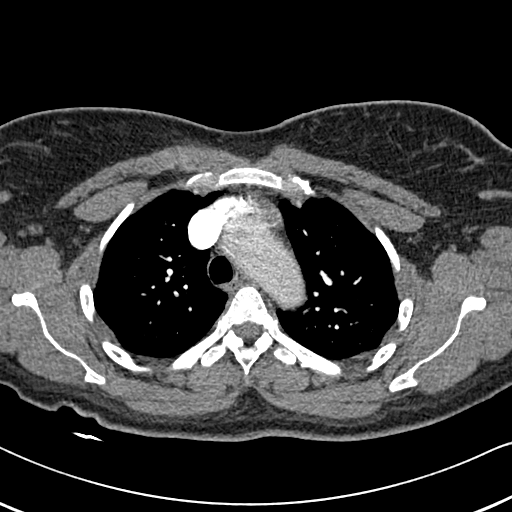
[im 250/320  lung]
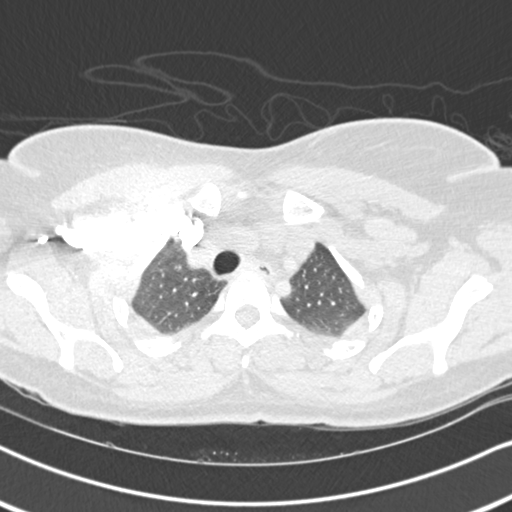
[im 264/320  soft-tissue]
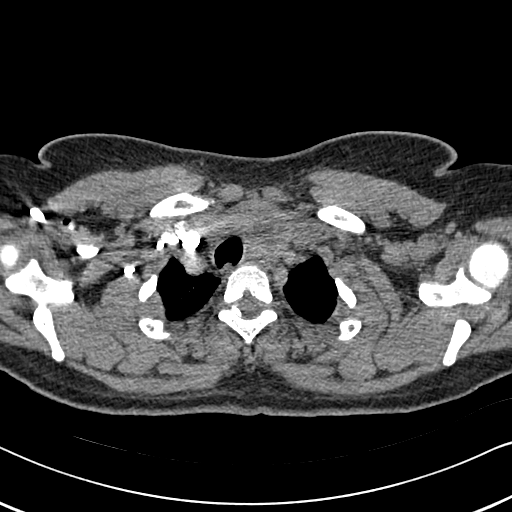
[im 278/320  lung]
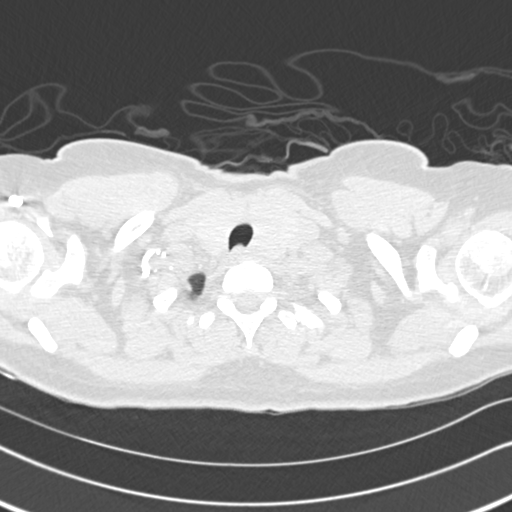
[im 306/320  soft-tissue]
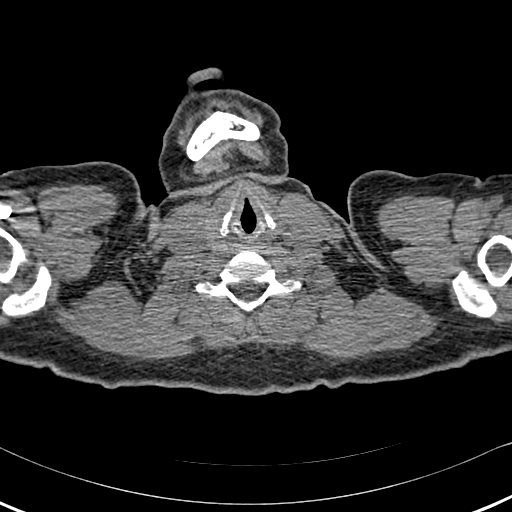

[Series 602: cor · coronal · 0.63mm/px · 3 of 101 slices shown]
[im 26/101  soft-tissue]
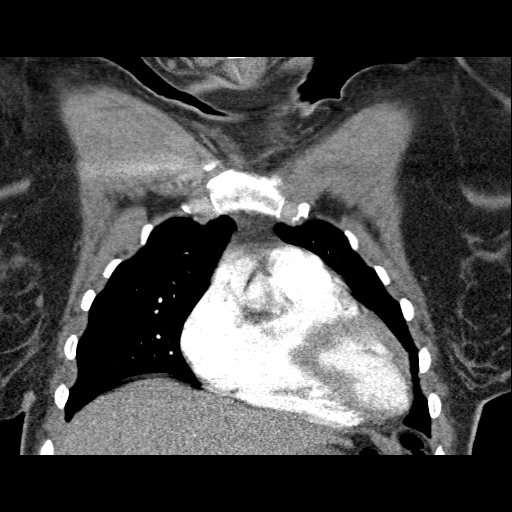
[im 51/101  soft-tissue]
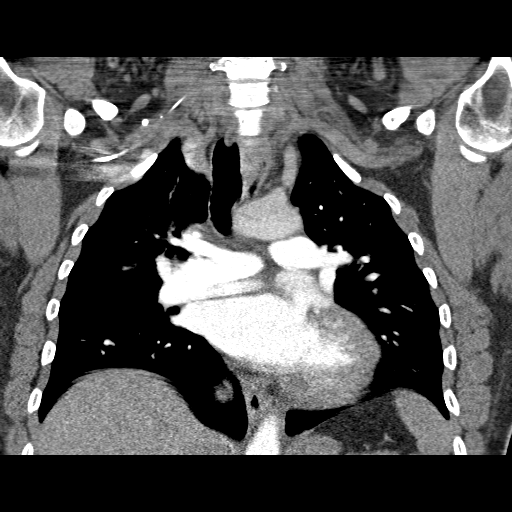
[im 76/101  soft-tissue]
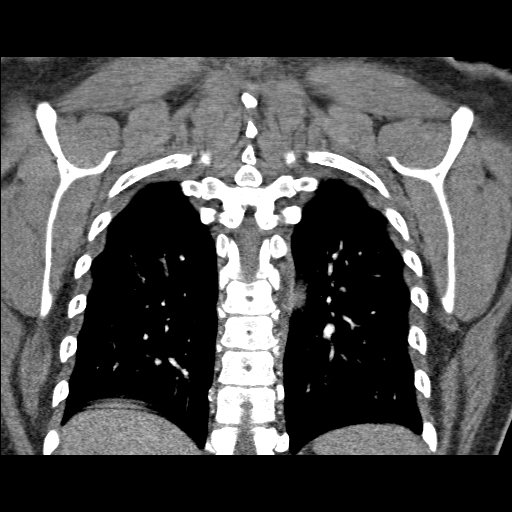

[19 of 46 positions shown; findings below may reference images not displayed]

FINDINGS: There are no filling defects in the pulmonary arterial
tree to suggest acute pulmonary thromboembolism.

2.7 cm left thyroid mass.

Negative abnormal mediastinal adenopathy.  Negative pericardial
effusion.

Lungs are clear.

No pneumothorax and no pleural effusion.

Review of the MIP images confirms the above findings.
IMPRESSION: No evidence of acute pulmonary thromboembolism.

2.7 cm left thyroid mass.  Thyroid ultrasound is recommended.

## 2015-03-28 ENCOUNTER — Encounter (HOSPITAL_BASED_OUTPATIENT_CLINIC_OR_DEPARTMENT_OTHER): Payer: Self-pay | Admitting: Emergency Medicine

## 2015-03-28 ENCOUNTER — Emergency Department (HOSPITAL_BASED_OUTPATIENT_CLINIC_OR_DEPARTMENT_OTHER)
Admission: EM | Admit: 2015-03-28 | Discharge: 2015-03-28 | Disposition: A | Discharge status: Home / Self Care | Payer: Self-pay | Attending: Emergency Medicine | Admitting: Emergency Medicine

## 2015-03-28 ENCOUNTER — Emergency Department (HOSPITAL_BASED_OUTPATIENT_CLINIC_OR_DEPARTMENT_OTHER): Payer: Self-pay

## 2015-03-28 DIAGNOSIS — Z8639 Personal history of other endocrine, nutritional and metabolic disease: Secondary | ICD-10-CM | POA: Insufficient documentation

## 2015-03-28 DIAGNOSIS — J018 Other acute sinusitis: Secondary | ICD-10-CM | POA: Insufficient documentation

## 2015-03-28 DIAGNOSIS — J40 Bronchitis, not specified as acute or chronic: Secondary | ICD-10-CM | POA: Insufficient documentation

## 2015-03-28 DIAGNOSIS — F419 Anxiety disorder, unspecified: Secondary | ICD-10-CM | POA: Insufficient documentation

## 2015-03-28 DIAGNOSIS — F17200 Nicotine dependence, unspecified, uncomplicated: Secondary | ICD-10-CM | POA: Insufficient documentation

## 2015-03-28 DIAGNOSIS — Z862 Personal history of diseases of the blood and blood-forming organs and certain disorders involving the immune mechanism: Secondary | ICD-10-CM | POA: Insufficient documentation

## 2015-03-28 DIAGNOSIS — Z79899 Other long term (current) drug therapy: Secondary | ICD-10-CM | POA: Insufficient documentation

## 2015-03-28 MED ORDER — AMOXICILLIN 500 MG PO CAPS: 1000.0000 mg | ORAL_CAPSULE | Freq: Two times a day (BID) | ORAL | Status: DC

## 2015-03-28 MED ORDER — ALBUTEROL SULFATE HFA 108 (90 BASE) MCG/ACT IN AERS
2.0000 | INHALATION_SPRAY | RESPIRATORY_TRACT | Status: DC | PRN
  Administered 2015-03-28: 2 via RESPIRATORY_TRACT
  Filled 2015-03-28: qty 6.7

## 2015-03-28 MED ORDER — LORAZEPAM 1 MG PO TABS
1.0000 mg | ORAL_TABLET | Freq: Once | ORAL | Status: AC
  Administered 2015-03-28: 1 mg via ORAL
  Filled 2015-03-28: qty 1

## 2015-03-28 NOTE — Discharge Instructions (Signed)
Return to the ED with any concerns including difficulty breathing despite using albuterol every 4 hours, not drinking fluids, decreased urine output, vomiting and not able to keep down liquids or medications, decreased level of alertness/lethargy, or any other alarming symptoms °

## 2015-03-28 NOTE — ED Provider Notes (Signed)
CSN: [id number]     Arrival date & time 03/28/15  1755 History   First MD Initiated Contact with Patient 03/28/15 2116     Chief Complaint  Patient presents with  . Panic Attack     (Consider location/radiation/quality/duration/timing/severity/associated sxs/prior Treatment) HPI  Pt presenting with c/o anxiety and panic attack as well as nasal congestion and cough. Pt states that she has been having panic attack which has been worse today.  She denies SI/HI.  She also states she has had nasal congestion for the past month as well as now starting to have facial pain and headaches associated.  She also has continued to have deep cough associated with her symptoms.  No fever/chills.  No vomiting.  No chest pain.  She has continued to eat and drink liquids well.  There are no other associated systemic symptoms, there are no other alleviating or modifying factors.   Past Medical History  Diagnosis Date  . Thyroid disease   . Left thyroid nodule 2012  . Sinus bradycardia 2012  . Anemia   . Dyspnea 2012   History reviewed. No pertinent past surgical history. History reviewed. No pertinent family history. Social History  Substance Use Topics  . Smoking status: Current Every Day Smoker  . Smokeless tobacco: None  . Alcohol Use: Yes     Comment: occ   OB History    No data available     Review of Systems  ROS reviewed and all otherwise negative except for mentioned in HPI    Allergies  Review of patient's allergies indicates no known allergies.  Home Medications   Prior to Admission medications   Medication Sig Start Date End Date Taking? Authorizing Provider  Pseudoeph-Doxylamine-DM-APAP (NYQUIL) 60-7.08-01-998 MG/30ML LIQD Take 30 mLs by mouth at bedtime as needed. For cough/congestion    Yes Historical Provider, MD  albuterol (PROVENTIL HFA;VENTOLIN HFA) 108 (90 BASE) MCG/ACT inhaler Inhale 1-2 puffs into the lungs every 6 (six) hours as needed for wheezing. 01/11/11 01/11/12   Devoria Albe, MD  amoxicillin (AMOXIL) 500 MG capsule Take 2 capsules (1,000 mg total) by mouth 2 (two) times daily. 03/28/15   Jerelyn Scott, MD  fish oil-omega-3 fatty acids 1000 MG capsule Take 1 g by mouth daily.      Historical Provider, MD  HYDROcodone-acetaminophen (NORCO) 7.5-325 MG per tablet Take 1 tablet by mouth every 4 (four) hours as needed.      Historical Provider, MD  LORazepam (ATIVAN) 1 MG tablet Take 1 mg by mouth every 8 (eight) hours.      Historical Provider, MD  naproxen sodium (ANAPROX) 220 MG tablet Take 660 mg by mouth daily as needed.      Historical Provider, MD  traMADol (ULTRAM) 50 MG tablet Take 50 mg by mouth every 6 (six) hours as needed. Maximum dose= 8 tablets per day     Historical Provider, MD   BP 104/65 mmHg  Pulse 68  Temp(Src) 98.2 F (36.8 C) (Oral)  Resp 18  Ht  (1.753 m)  Wt 185 lb (83.915 kg)  BMI 27.31 kg/m2  SpO2 100%  LMP 03/07/2015  Vitals reviewed Physical Exam  Physical Examination: General appearance - alert, well appearing, and in no distress Mental status - alert, oriented to person, place, and time Eyes - no conjunctival injection no scleral icterus Face- ttp over frontal and maxillary sinuses Mouth - mucous membranes moist, pharynx normal without lesions Chest - clear to auscultation, no rales or rhonchi, symmetric air  entry, normal respiratory effort, mild wheezes bilaterally Heart - normal rate, regular rhythm, normal S1, S2, no murmurs, rubs, clicks or gallops Abdomen - soft, nontender, nondistended, no masses or organomegaly Neurological - alert, oriented, normal speech Extremities - peripheral pulses normal, no pedal edema, no clubbing or cyanosis Skin - normal coloration and turgor, no rashes  ED Course  Procedures (including critical care time) Labs Review Labs Reviewed - No data to display  Imaging Review Dg Chest 2 View  03/28/2015  CLINICAL DATA:  Shortness of breath and congestion for 2 weeks. Initial  encounter. EXAM: CHEST  2 VIEW COMPARISON:  CT chest and PA and lateral chest 01/07/2011. FINDINGS: The lungs are clear. Heart size is normal. There is no pneumothorax or pleural effusion. No bony abnormality. IMPRESSION: Negative chest. Electronically Signed   By: Drusilla Kanner M.D.   On: 03/28/2015 18:35   I have personally reviewed and evaluated these images and lab results as part of my medical decision-making.   EKG Interpretation None      MDM   Final diagnoses:  Other acute sinusitis  Bronchitis  Anxiety    Pt presenting with c/o cough and nasal congestion as well as anxiety.  She has some mild wheezing and congested cough on CXR.  Due to nasal congestion and facial pain x 1 month will start on amoxicillin for sinusitis.  Pt advised to f/u with PMD for her anxiety. She is currently not having SI/HI.  Discharged with strict return precautions.  Pt agreeable with plan.    Jerelyn Scott, MD 03/28/15 2259

## 2015-03-28 NOTE — ED Notes (Signed)
Pt was assisted to triage by her friend, pt stating it was hard to breath and that she feels tight, slight language barrier, pt refused to answer certain questions, stating she has nodule, per history pt had thyroid nodule but has not followed up in more that 5 years, also not taking any prescribed medications. After triage, pt able to ambulate without difficulty to waiting room, sitting and talking with friend very loudly, in nad. No sob witnessed or noted during assessment or while patient was talking

## 2015-03-28 NOTE — ED Notes (Signed)
Pt and family member given instructions as per chart. Verbalizes understanding. No questions.

## 2015-03-28 NOTE — ED Notes (Signed)
MD at bedside. 

## 2015-03-28 NOTE — ED Notes (Signed)
Pt states she has felt sob and congested x 2 weeks, states she has panick attack for 2 weeks and it is not getting better

## 2015-03-28 NOTE — ED Notes (Signed)
Pt describes sinus congestion with productive cough-clear sputum. Also describes generalized abd pain when coughing. Denies vaginal d/c or urinary s/s. Also feeling anxious.

## 2015-03-28 NOTE — ED Notes (Signed)
Pt ambulating without difficulty, talking in full sentences with visitor, no acute distress or SOB noted.

## 2015-04-01 ENCOUNTER — Ambulatory Visit: Payer: Self-pay | Attending: Internal Medicine | Admitting: Physician Assistant

## 2015-04-01 ENCOUNTER — Encounter (HOSPITAL_BASED_OUTPATIENT_CLINIC_OR_DEPARTMENT_OTHER): Payer: Self-pay | Admitting: Clinical

## 2015-04-01 ENCOUNTER — Encounter: Payer: Self-pay | Admitting: Physician Assistant

## 2015-04-01 VITALS — BP 117/72 | HR 71 | Temp 98.3°F | Resp 18 | Ht 68.0 in | Wt 185.0 lb

## 2015-04-01 DIAGNOSIS — F322 Major depressive disorder, single episode, severe without psychotic features: Secondary | ICD-10-CM

## 2015-04-01 DIAGNOSIS — J069 Acute upper respiratory infection, unspecified: Secondary | ICD-10-CM | POA: Insufficient documentation

## 2015-04-01 DIAGNOSIS — R0789 Other chest pain: Secondary | ICD-10-CM | POA: Insufficient documentation

## 2015-04-01 DIAGNOSIS — J019 Acute sinusitis, unspecified: Secondary | ICD-10-CM | POA: Insufficient documentation

## 2015-04-01 DIAGNOSIS — F329 Major depressive disorder, single episode, unspecified: Secondary | ICD-10-CM

## 2015-04-01 DIAGNOSIS — E079 Disorder of thyroid, unspecified: Secondary | ICD-10-CM | POA: Insufficient documentation

## 2015-04-01 DIAGNOSIS — R002 Palpitations: Secondary | ICD-10-CM | POA: Insufficient documentation

## 2015-04-01 DIAGNOSIS — R61 Generalized hyperhidrosis: Secondary | ICD-10-CM | POA: Insufficient documentation

## 2015-04-01 DIAGNOSIS — F32A Depression, unspecified: Secondary | ICD-10-CM

## 2015-04-01 DIAGNOSIS — F419 Anxiety disorder, unspecified: Secondary | ICD-10-CM

## 2015-04-01 DIAGNOSIS — R51 Headache: Secondary | ICD-10-CM | POA: Insufficient documentation

## 2015-04-01 DIAGNOSIS — F410 Panic disorder [episodic paroxysmal anxiety] without agoraphobia: Secondary | ICD-10-CM | POA: Insufficient documentation

## 2015-04-01 DIAGNOSIS — J0110 Acute frontal sinusitis, unspecified: Secondary | ICD-10-CM

## 2015-04-01 DIAGNOSIS — Z79899 Other long term (current) drug therapy: Secondary | ICD-10-CM | POA: Insufficient documentation

## 2015-04-01 MED ORDER — HYDROCODONE-HOMATROPINE 5-1.5 MG/5ML PO SYRP: 5.0000 mL | ORAL_SOLUTION | Freq: Three times a day (TID) | ORAL | Status: DC | PRN

## 2015-04-01 MED ORDER — LORAZEPAM 1 MG PO TABS: 1.0000 mg | ORAL_TABLET | Freq: Three times a day (TID) | ORAL | Status: DC

## 2015-04-01 MED ORDER — SERTRALINE HCL 50 MG PO TABS: 50.0000 mg | ORAL_TABLET | Freq: Every day | ORAL | Status: DC

## 2015-04-01 MED ORDER — BENZONATATE 100 MG PO CAPS: 100.0000 mg | ORAL_CAPSULE | Freq: Two times a day (BID) | ORAL | Status: DC | PRN

## 2015-04-01 MED ORDER — AMOXICILLIN-POT CLAVULANATE 875-125 MG PO TABS: 1.0000 | ORAL_TABLET | Freq: Two times a day (BID) | ORAL | Status: DC

## 2015-04-01 MED FILL — SERTRALINE HCL 50 MG TABLET: 50 | 30 days supply | Qty: 30 | Fill #0

## 2015-04-01 MED FILL — AMOX-CLAV 875-125 MG TABLET: 875-125 | 10 days supply | Qty: 20 | Fill #0

## 2015-04-01 MED FILL — BENZONATATE 100 MG CAPSULE: 100 | 10 days supply | Qty: 20 | Fill #0

## 2015-04-01 NOTE — Progress Notes (Signed)
Jessica Shea  ZOX:[id number]  [id number]  DOB - 29-Sep-1970  Chief Complaint  Patient presents with  . Panic Attack  . URI       Subjective:   Jessica Shea is a 45 y.o. female here today for establishment of care. She was hospitalized in the ED on 03/28/2015 after a panic attack at home. She had difficulty sleeping. She was very diaphoretic and having palpitations. She was short of breath and having some atypical chest pain. She did not have homicidal deviation. She may have had some suicidal ideation but didn't tell the emergency department staff.  She also complained of nasal congestion and cough for the last 3-4 weeks. She's had increasing shortness of breath and facial pain. She's had headache and drainage from her nose. Her ears hurt as well.  She did not have labs done in the emergency room. Chest x-ray was negative for acute process. She was treated for sinusitis and bronchitis with amoxicillin. She did not get the prescription filled. Her symptoms have not improve at all. She is also having difficulty sleeping. She can't get motivated to go to work. She's tearful. Her appetite is depressed. She states that occasionally she feels like she wants to end her life. She admits to be lonely. Her PHQ-9 and GAD-7 scores were significantly abnormal.   ROS: GEN: denies fever or chills, denies change in weight Skin: denies lesions or rashes HEENT: denies headache, earache, epistaxis, sore throat, or neck pain LUNGS: denies SHOB, dyspnea, PND, orthopnea CV: denies CP or palpitations ABD: denies abd pain, N or V EXT: denies muscle spasms or swelling; no pain in lower ext, no weakness NEURO: denies numbness or tingling, denies sz, stroke or TIA  ALLERGIES: No Known Allergies  PAST MEDICAL HISTORY: Past Medical History  Diagnosis Date  . Thyroid disease   . Left thyroid nodule 2012  . Sinus bradycardia 2012  . Anemia   . Dyspnea 2012    PAST SURGICAL HISTORY: History  reviewed. No pertinent past surgical history.  MEDICATIONS AT HOME: Prior to Admission medications   Medication Sig Start Date End Date Taking? Authorizing Provider  naproxen sodium (ANAPROX) 220 MG tablet Take 660 mg by mouth daily as needed.     Yes Historical Provider, MD  Pseudoeph-Doxylamine-DM-APAP (NYQUIL) 60-7.08-01-998 MG/30ML LIQD Take 30 mLs by mouth at bedtime as needed. For cough/congestion    Yes Historical Provider, MD  albuterol (PROVENTIL HFA;VENTOLIN HFA) 108 (90 BASE) MCG/ACT inhaler Inhale 1-2 puffs into the lungs every 6 (six) hours as needed for wheezing. 01/11/11 01/11/12  Devoria Albe, MD  amoxicillin-clavulanate (AUGMENTIN) 875-125 MG tablet Take 1 tablet by mouth 2 (two) times daily. 04/01/15   Norma Montemurro Netta Cedars, PA-C  benzonatate (TESSALON) 100 MG capsule Take 1 capsule (100 mg total) by mouth 2 (two) times daily as needed for cough. 04/01/15   Vivianne Master, PA-C  fish oil-omega-3 fatty acids 1000 MG capsule Take 1 g by mouth daily. Reported on 04/01/2015    Historical Provider, MD  HYDROcodone-acetaminophen (NORCO) 7.5-325 MG per tablet Take 1 tablet by mouth every 4 (four) hours as needed. Reported on 04/01/2015    Historical Provider, MD  HYDROcodone-homatropine [facility]) 5-1.5 MG/5ML syrup Take 5 mLs by mouth every 8 (eight) hours as needed for cough. 04/01/15   Jessica Shea Netta Cedars, PA-C  LORazepam (ATIVAN) 1 MG tablet Take 1 tablet (1 mg total) by mouth every 8 (eight) hours. Reported on 04/01/2015 04/01/15   Vivianne Master, PA-C  sertraline (  ZOLOFT) 50 MG tablet Take 1 tablet (50 mg total) by mouth daily. 04/01/15   Jessica Shea Netta Cedars, PA-C  traMADol (ULTRAM) 50 MG tablet Take 50 mg by mouth every 6 (six) hours as needed. Reported on 04/01/2015    Historical Provider, MD     Objective:   Filed Vitals:   04/01/15 1018 04/01/15 1022  BP:  117/72  Pulse:  71  Temp:  98.3 F (36.8 C)  TempSrc:  Oral  Resp:  18  Height:  (1.727 m)   Weight: 185 lb (83.915 kg)   SpO2:  100%     Exam General appearance : Awake, alert, not in any distress. Speech Clear. Not toxic looking HEENT: Atraumatic and Normocephalic, pupils equally reactive to light and accomodation; nose-boggy turbinates. Ears-injected, no drainage. Tender frontal sinuses. Neck: supple, no JVD.+ cervical lymphadenopathy.  Chest:Good air entry bilaterally, no added sounds  CVS: S1 S2 regular, no murmurs.  Abdomen: Bowel sounds present, Non tender and not distended with no guarding, rigidity or rebound. Bulging from hernia Neurology: Awake alert, and oriented X 3, CN II-XII intact, Non focal    Assessment & Plan  1. Acute Sinusitis  -Augmentin 2.URI   -Tessalon perles   -Hycodan Cough syrup 3. Depression m/w anxiety with panic attacks  -seen today by SW  -Zoloft 50 mg daily  -Lorazepam as needed  -referral to [facility]   Return in about 3 weeks (around 04/22/2015). For labs and routine health maintenance.  The patient was given clear instructions to go to ER or return to medical center if symptoms don't improve, worsen or new problems develop. The patient verbalized understanding. The patient was told to call to get lab results if they haven't heard anything in the next week.   This note has been created with Education officer, environmental. Any transcriptional errors are unintentional.    Scot Jun, PA-C [facility] and [facility] [place], [place] [phone]   04/01/2015, 10:59 AM

## 2015-04-01 NOTE — Progress Notes (Signed)
Patient here for anxiety. Patient reports panic attack everyday, does not go away.   Patient reports pain in her face and head, currently at level 10, described "really bad".   Patient feels unsafe at home. "That I might die and nobody is going to be there".   Patient sometimes has thoughts of hurting herself, patient denies having thoughts of hurting herself currently. Patient denies thoughts of harming others.

## 2015-04-01 NOTE — Progress Notes (Addendum)
ASSESSMENT: Pt currently experiencing major depressive disorder, single episode, severe without psychotic features; pt needs to f/u with PCP and [facility]; would benefit from psychoeducation and psychotherapy regarding coping with symptoms of depression. Stage of Change: contemplative  PLAN: 1. F/U with behavioral health consultant in 3 days via phone, 2 weeks in office 2. Psychiatric Medications: Ativan, Zoloft (begin both today) 3. Behavioral recommendation(s):   -Read educational material regarding coping with symptoms of anxiety and depression -Consider daily walk as discussed in office visit -Call mobile crisis line if needed  SUBJECTIVE: Pt. referred byTiffany Danelle Earthly for symptoms of anxiety and depression :  Pt. reports the following symptoms/concerns: Pt states that she does not have the energy to get out of bed most days, no enjoyment, does not have an appetite, difficulty concentrating on anything or making decisions, thoughts of death, but not feeling suicidal today, says she just does not want to feel this way, denies feeling symptoms of depression in previous years.  Duration of problem: At least three months Severity: severe  OBJECTIVE: Orientation & Cognition: Oriented x3. Thought processes normal and appropriate to situation. Mood: low. Affect: appropriate Appearance: appropriate Risk of harm to self or others: no risk of harm to self or others today Substance use: none Assessments administered: PHQ9: 27/ GAD7: 21  Diagnosis: Major depressive disorder, single episode, severe without psychotic features CPT Code: F32.2 -------------------------------------------- Other(s) present in the room: sister  Time spent with patient in exam room: 30 minutes 10:45am-11:15am

## 2015-04-04 ENCOUNTER — Telehealth: Payer: Self-pay | Admitting: Clinical

## 2015-04-04 NOTE — Telephone Encounter (Signed)
Attempt to f/u with Pt; pt number out of service, left HIPPA-compliant message with emergency contact number to have pt contact Kory Panjwani at [facility] at [phone].

## 2015-04-09 ENCOUNTER — Emergency Department (HOSPITAL_BASED_OUTPATIENT_CLINIC_OR_DEPARTMENT_OTHER)
Admission: EM | Admit: 2015-04-09 | Discharge: 2015-04-10 | Disposition: A | Discharge status: Home / Self Care | Payer: Self-pay | Attending: Emergency Medicine | Admitting: Emergency Medicine

## 2015-04-09 ENCOUNTER — Emergency Department (HOSPITAL_BASED_OUTPATIENT_CLINIC_OR_DEPARTMENT_OTHER): Payer: Self-pay

## 2015-04-09 ENCOUNTER — Encounter (HOSPITAL_BASED_OUTPATIENT_CLINIC_OR_DEPARTMENT_OTHER): Payer: Self-pay | Admitting: Emergency Medicine

## 2015-04-09 DIAGNOSIS — Z8639 Personal history of other endocrine, nutritional and metabolic disease: Secondary | ICD-10-CM | POA: Insufficient documentation

## 2015-04-09 DIAGNOSIS — F17210 Nicotine dependence, cigarettes, uncomplicated: Secondary | ICD-10-CM | POA: Insufficient documentation

## 2015-04-09 DIAGNOSIS — S8992XA Unspecified injury of left lower leg, initial encounter: Secondary | ICD-10-CM | POA: Insufficient documentation

## 2015-04-09 DIAGNOSIS — W108XXA Fall (on) (from) other stairs and steps, initial encounter: Secondary | ICD-10-CM | POA: Insufficient documentation

## 2015-04-09 DIAGNOSIS — Y9389 Activity, other specified: Secondary | ICD-10-CM | POA: Insufficient documentation

## 2015-04-09 DIAGNOSIS — Z862 Personal history of diseases of the blood and blood-forming organs and certain disorders involving the immune mechanism: Secondary | ICD-10-CM | POA: Insufficient documentation

## 2015-04-09 DIAGNOSIS — M7989 Other specified soft tissue disorders: Secondary | ICD-10-CM

## 2015-04-09 DIAGNOSIS — Z792 Long term (current) use of antibiotics: Secondary | ICD-10-CM | POA: Insufficient documentation

## 2015-04-09 DIAGNOSIS — Y9289 Other specified places as the place of occurrence of the external cause: Secondary | ICD-10-CM | POA: Insufficient documentation

## 2015-04-09 DIAGNOSIS — Z79899 Other long term (current) drug therapy: Secondary | ICD-10-CM | POA: Insufficient documentation

## 2015-04-09 DIAGNOSIS — S8991XA Unspecified injury of right lower leg, initial encounter: Secondary | ICD-10-CM | POA: Insufficient documentation

## 2015-04-09 DIAGNOSIS — Y998 Other external cause status: Secondary | ICD-10-CM | POA: Insufficient documentation

## 2015-04-09 MED ORDER — TRAMADOL HCL 50 MG PO TABS
50.0000 mg | ORAL_TABLET | Freq: Once | ORAL | Status: AC
  Administered 2015-04-10: 50 mg via ORAL
  Filled 2015-04-09: qty 1

## 2015-04-09 MED ORDER — ENOXAPARIN SODIUM 100 MG/ML ~~LOC~~ SOLN
1.0000 mg/kg | Freq: Once | SUBCUTANEOUS | Status: AC
  Administered 2015-04-10: 85 mg via SUBCUTANEOUS
  Filled 2015-04-09: qty 1

## 2015-04-09 NOTE — ED Notes (Signed)
Pt in c/o pain to bilateral lower legs after tripping on her stairs today and falling. Ambulatory with limp.

## 2015-04-09 NOTE — ED Provider Notes (Signed)
CSN: [id number]     Arrival date & time 04/09/15  2311 History  By signing my name below, I, Freida Busman, attest that this documentation has been prepared under the direction and in the presence of Tomasita Crumble, MD . Electronically Signed: Freida Busman, Scribe. 04/09/2015. 11:33 PM.     Chief Complaint  Patient presents with  . Fall  . Leg Pain   The history is provided by the patient. No language interpreter was used.     HPI Comments:  Jessica Shea is a 45 y.o. female who presents to the Emergency Department complaining of constant pain to her BLE that worsened after fall today. She reports pain to her right calf and left knee. Pt notes she mis stepped, fell and landed on her knees. She denies LOC and head injury. She also notes she had another fall ~ 6 days ago and that is when her pain began. She denies recent long periods of immobilization, recent surgery and h/o PE/DVT. No alleviating factors noted.   Past Medical History  Diagnosis Date  . Thyroid disease   . Left thyroid nodule 2012  . Sinus bradycardia 2012  . Anemia   . Dyspnea 2012   History reviewed. No pertinent past surgical history. History reviewed. No pertinent family history. Social History  Substance Use Topics  . Smoking status: Current Every Day Smoker -- 0.25 packs/day    Types: Cigarettes  . Smokeless tobacco: None  . Alcohol Use: No   OB History    No data available     Review of Systems  10 systems reviewed and all are negative for acute change except as noted in the HPI.   Allergies  Review of patient's allergies indicates no known allergies.  Home Medications   Prior to Admission medications   Medication Sig Start Date End Date Taking? Authorizing Provider  albuterol (PROVENTIL HFA;VENTOLIN HFA) 108 (90 BASE) MCG/ACT inhaler Inhale 1-2 puffs into the lungs every 6 (six) hours as needed for wheezing. 01/11/11 01/11/12  Devoria Albe, MD  amoxicillin-clavulanate (AUGMENTIN) 875-125 MG tablet Take  1 tablet by mouth 2 (two) times daily. 04/01/15   Tiffany Netta Cedars, PA-C  benzonatate (TESSALON) 100 MG capsule Take 1 capsule (100 mg total) by mouth 2 (two) times daily as needed for cough. 04/01/15   Vivianne Master, PA-C  fish oil-omega-3 fatty acids 1000 MG capsule Take 1 g by mouth daily. Reported on 04/01/2015    Historical Provider, MD  HYDROcodone-acetaminophen (NORCO) 7.5-325 MG per tablet Take 1 tablet by mouth every 4 (four) hours as needed. Reported on 04/01/2015    Historical Provider, MD  HYDROcodone-homatropine [facility]) 5-1.5 MG/5ML syrup Take 5 mLs by mouth every 8 (eight) hours as needed for cough. 04/01/15   Tiffany Netta Cedars, PA-C  LORazepam (ATIVAN) 1 MG tablet Take 1 tablet (1 mg total) by mouth every 8 (eight) hours. Reported on 04/01/2015 04/01/15   Vivianne Master, PA-C  naproxen sodium (ANAPROX) 220 MG tablet Take 660 mg by mouth daily as needed.      Historical Provider, MD  Pseudoeph-Doxylamine-DM-APAP (NYQUIL) 60-7.08-01-998 MG/30ML LIQD Take 30 mLs by mouth at bedtime as needed. For cough/congestion     Historical Provider, MD  sertraline (ZOLOFT) 50 MG tablet Take 1 tablet (50 mg total) by mouth daily. 04/01/15   Tiffany Netta Cedars, PA-C  traMADol (ULTRAM) 50 MG tablet Take 50 mg by mouth every 6 (six) hours as needed. Reported on 04/01/2015    Historical Provider, MD  BP 99/62 mmHg  Pulse 71  Temp(Src) 99 F (37.2 C) (Oral)  Resp 18  SpO2 100%  LMP 03/07/2015 Physical Exam  Constitutional: She is oriented to person, place, and time. She appears well-developed and well-nourished. No distress.  HENT:  Head: Normocephalic and atraumatic.  Nose: Nose normal.  Mouth/Throat: Oropharynx is clear and moist. No oropharyngeal exudate.  Eyes: Conjunctivae and EOM are normal. Pupils are equal, round, and reactive to light. No scleral icterus.  Neck: Normal range of motion. Neck supple. No JVD present. No tracheal deviation present. No thyromegaly present.  Cardiovascular: Normal rate,  regular rhythm and normal heart sounds.  Exam reveals no gallop and no friction rub.   No murmur heard. Pulmonary/Chest: Effort normal and breath sounds normal. No respiratory distress. She has no wheezes. She exhibits no tenderness.  Abdominal: Soft. Bowel sounds are normal. She exhibits no distension and no mass. There is no tenderness. There is no rebound and no guarding.  Musculoskeletal: Normal range of motion.  RLE edema and calf TTP, +Homans sign Left knee: nml ROM; No TTP nml pulses and sensation distally   Lymphadenopathy:    She has no cervical adenopathy.  Neurological: She is alert and oriented to person, place, and time. No cranial nerve deficit. She exhibits normal muscle tone.  Skin: Skin is warm and dry. No rash noted. No erythema. No pallor.  Nursing note and vitals reviewed.   ED Course  Procedures   DIAGNOSTIC STUDIES:  Oxygen Saturation is 100% on RA, normal by my interpretation.    COORDINATION OF CARE:  11:31 PM Discussed treatment plan with pt at bedside and pt agreed to plan.  Labs Review Labs Reviewed  CK - Abnormal; Notable for the following:    Total CK 271 (*)    All other components within normal limits    Imaging Review Dg Tibia/fibula Right  04/10/2015  CLINICAL DATA:  Fall yesterday with right leg swelling. EXAM: RIGHT TIBIA AND FIBULA - 2 VIEW COMPARISON:  None. FINDINGS: No fracture. Knee and ankle alignment maintained. There is mild diffuse soft tissue edema. Small knee joint effusion suspected. No radiopaque foreign body. IMPRESSION: Soft tissue edema.  No acute bony abnormality. Electronically Signed   By: Rubye Oaks M.D.   On: 04/10/2015 00:27   I have personally reviewed and evaluated these images and lab results as part of my medical decision-making.   EKG Interpretation None      MDM   Final diagnoses:  None    Patient presents to the ED for leg pain and swelling.  PE is concerning for possible DVT.  She was given lovenox  in the ED and tramadol for pain. She was advised to return tomorrow for ultrasound study and the order has been placed in the computer.  Xray is negative for any fracture.  CK does not show rhabdo at 271.  She appears well and in NAD.  VS remain within her normal limits and she is safe for DC.   I personally performed the services described in this documentation, which was scribed in my presence. The recorded information has been reviewed and is accurate.     Tomasita Crumble, MD 04/10/15 [phone]

## 2015-04-10 ENCOUNTER — Ambulatory Visit (HOSPITAL_BASED_OUTPATIENT_CLINIC_OR_DEPARTMENT_OTHER)
Admission: RE | Admit: 2015-04-10 | Discharge: 2015-04-10 | Disposition: A | Discharge status: Home / Self Care | Payer: Self-pay | Source: Ambulatory Visit | Attending: Emergency Medicine | Admitting: Emergency Medicine

## 2015-04-10 ENCOUNTER — Other Ambulatory Visit (HOSPITAL_BASED_OUTPATIENT_CLINIC_OR_DEPARTMENT_OTHER): Payer: Self-pay | Admitting: Emergency Medicine

## 2015-04-10 DIAGNOSIS — M7989 Other specified soft tissue disorders: Secondary | ICD-10-CM

## 2015-04-10 LAB — CK: CK TOTAL: 271 U/L — AB (ref 38–234)

## 2015-04-10 NOTE — Discharge Instructions (Signed)
Edema Jessica Shea, your xray does not show any broken bones.  You may have a blood clot in that leg.  You were given a blood thinner that will last until tomorrow.  Tomorrow you need to come back and have an ultrasound done on your leg.  If any symptoms worsen, come back to the ED immediately. Thank you. Edema is an abnormal buildup of fluids. It is more common in your legs and thighs. Painless swelling of the feet and ankles is more likely as a person ages. It also is common in looser skin, like around your eyes. HOME CARE   Keep the affected body part above the level of the heart while lying down.  Do not sit still or stand for a long time.  Do not put anything right under your knees when you lie down.  Do not wear tight clothes on your upper legs.  Exercise your legs to help the puffiness (swelling) go down.  Wear elastic bandages or support stockings as told by your doctor.  A low-salt diet may help lessen the puffiness.  Only take medicine as told by your doctor. GET HELP IF:  Treatment is not working.  You have heart, liver, or kidney disease and notice that your skin looks puffy or shiny.  You have puffiness in your legs that does not get better when you raise your legs.  You have sudden weight gain for no reason. GET HELP RIGHT AWAY IF:   You have shortness of breath or chest pain.  You cannot breathe when you lie down.  You have pain, redness, or warmth in the areas that are puffy.  You have heart, liver, or kidney disease and get edema all of a sudden.  You have a fever and your symptoms get worse all of a sudden. MAKE SURE YOU:   Understand these instructions.  Will watch your condition.  Will get help right away if you are not doing well or get worse.   This information is not intended to replace advice given to you by your health care provider. Make sure you discuss any questions you have with your health care provider.   Document Released: 08/08/2007  Document Revised: 02/24/2013 Document Reviewed: 12/12/2012 Elsevier Interactive Patient Education 2016 [employer].  Substance Abuse Treatment Programs  Intensive Outpatient Programs [facility] Services     [address]      [place], [place]                   [phone]       [facility] [address] [place] [address] [place], [place] [phone]  [facility]     (Inpatient and outpatient)     [address]           [phone]    [facility] [phone] (Suboxone and Methadone)  [address]      [place], [place] [address]      [phone]       [address] [place], [place] [phone]  [facility] (Outpatient/Inpatient, [facility])    (insurance only) [phone]             [facility] (Groups & Residential) [place], [place] [phone]     [facility]     [address]     [place], [place]      [phone]       [facility] (for caregivers and family)  [address] [place], [place] [phone]      Residential Treatment Programs [facility]      [address], [place], [place] [address]  [phone]       [facility] [address] [place], [place] [address] [phone]  [facility] [place]        [phone]       [facility] [phone]  [facility] ([facility]             [address]                                         [place], [place]                                                [phone] or [phone]                               [facility] [address] [place], [address] [phone]  [facility]    [address]      [place], [place]     [phone]       [facility] [address] [place], [place] [phone]  [facility]   [address] [place] [address] [place], [place]  [address]     [phone]      Admissions: 8am-3pm M-F  [facility] Services (RTS) [address] [place], [place] [phone]  [facility]: Residential Program [phone] Days)   [place], [place]      [phone] or [phone]     [facility]: [facility] [place], [place] (Walk in Hours over the weekend or by referral)  [facility] [address] [place], [place], [place] [address] [phone]  Crisis Mobile: [facility]:  [phone] (for crisis response 24 hours a day) [facility] Hotline:      [phone] Outpatient Psychiatry and Counseling  [facility]: Mobile Crisis Management 24 hours:  [phone]  [facility] Acute Care of the [employer] sliding scale fee and walk in schedule: M-F 8am-12pm/1pm-3pm [address]  [place], [place] [address] [phone]  [facility] [address] [place], [place] [address] [phone]  [facility] ([facility] [employer])- new patient walk-in appointments available Monday - Friday 8am -3pm.          [address] [place], [place] [address] [phone] or crisis line- [phone]  [facility]/ Intensive Outpatient Therapy Program [address] [place], [place] [address] [phone]  [facility]                  Crisis Services      [phone] [address]     [place], [place] [address]                 [facility]   [facility] [phone]. [address] [place], [place] [address]   [employer] of Care          [address],  [place], [place] [address]       (  [phone]  [facility] [address] [place], [place] [address] [phone]  [facility]    [address]    [place], [place] [address]     [phone]       Andee Poles,  MD     [address]     [place] [address]     [phone]       [facility] [address] [place] [address]  [facility]     [address] [place], [place]      [phone]       [facility] Eulogio Ditch, MD [address] [place], [place] [address] [phone]  [facility]     [address]     [place], [place] [address]     [phone]       [facility] [address] [place], [place] [address] [phone] Resources for Temporary Residential Assistance/Crisis Centers  [facility]) M-F 8am-3pm   [address] [place], [place] [address]   [phone] Services include: laundry, barbering, support groups, case management, phone  & computer access, showers, AA/NA mtgs, mental health/substance abuse nurse, job skills class, disability information, VA assistance, spiritual classes, etc.   HOMELESS SHELTERS  [facility]     [facility]   [address] [place]     [phone]              [employer] (women and children)       [address] [place], [place] [address] [phone] [email or web address] for application and process Application Required  [facility] [employer] [facility]   [address]    [place] [address]     [phone]                    [facility] [address] [place], [place] [address] [phone] application appt.) Application Required  [facility] (women only)    [address]     [place], [place] [address]     [phone]      Intake starts 6pm daily Need valid ID, SSC, & Police report [employer] [address] [place], [place] [phone] Application Required  [employer] (men only)     [address]      [place],  [place]     [phone]       [facility] (Pregnant women only) [address] [place], [place] [phone]  [facility]      [address]      [place], [place] [address]     [phone]             [facility] [address] [place], [place] [phone] 90 day commitment/SA/Application process  [facility](men only)     [address]     [place], [place]     [phone]       Check-in at [facility] [address] [place], [place] [address] [phone] Men/Women/Women and Children must be there by 7 pm  [facility] [place], [place] [phone]

## 2015-04-11 NOTE — ED Provider Notes (Signed)
  Physical Exam  BP 97/53 mmHg  Pulse 59  Temp(Src) 98.4 F (36.9 C) (Oral)  Resp 20  SpO2 100%  LMP 03/07/2015  Physical Exam  ED Course  Procedures  MDM Gave patient result of Doppler that showed hematoma without DVT      Benjiman Core, MD 04/11/15 [phone]

## 2015-04-22 ENCOUNTER — Encounter: Payer: Self-pay | Admitting: Family Medicine

## 2015-04-22 ENCOUNTER — Ambulatory Visit (HOSPITAL_BASED_OUTPATIENT_CLINIC_OR_DEPARTMENT_OTHER): Payer: Self-pay | Admitting: Clinical

## 2015-04-22 ENCOUNTER — Ambulatory Visit: Payer: Self-pay | Attending: Family Medicine | Admitting: Family Medicine

## 2015-04-22 VITALS — BP 102/71 | HR 62 | Temp 97.9°F | Resp 15 | Ht 68.0 in | Wt 183.6 lb

## 2015-04-22 DIAGNOSIS — F418 Other specified anxiety disorders: Secondary | ICD-10-CM

## 2015-04-22 DIAGNOSIS — R0982 Postnasal drip: Secondary | ICD-10-CM | POA: Insufficient documentation

## 2015-04-22 DIAGNOSIS — J0121 Acute recurrent ethmoidal sinusitis: Secondary | ICD-10-CM

## 2015-04-22 DIAGNOSIS — Z131 Encounter for screening for diabetes mellitus: Secondary | ICD-10-CM

## 2015-04-22 DIAGNOSIS — J029 Acute pharyngitis, unspecified: Secondary | ICD-10-CM

## 2015-04-22 DIAGNOSIS — R51 Headache: Secondary | ICD-10-CM | POA: Insufficient documentation

## 2015-04-22 DIAGNOSIS — T148XXA Other injury of unspecified body region, initial encounter: Secondary | ICD-10-CM

## 2015-04-22 DIAGNOSIS — T148 Other injury of unspecified body region: Secondary | ICD-10-CM | POA: Insufficient documentation

## 2015-04-22 DIAGNOSIS — M79661 Pain in right lower leg: Secondary | ICD-10-CM | POA: Insufficient documentation

## 2015-04-22 DIAGNOSIS — Z79899 Other long term (current) drug therapy: Secondary | ICD-10-CM | POA: Insufficient documentation

## 2015-04-22 LAB — POCT GLYCOSYLATED HEMOGLOBIN (HGB A1C): HEMOGLOBIN A1C: 5.2

## 2015-04-22 MED ORDER — PREDNISONE 20 MG PO TABS: 20.0000 mg | ORAL_TABLET | Freq: Every day | ORAL | Status: DC

## 2015-04-22 MED ORDER — AMOXICILLIN 500 MG PO CAPS: 500.0000 mg | ORAL_CAPSULE | Freq: Three times a day (TID) | ORAL | Status: DC

## 2015-04-22 MED ORDER — FLUTICASONE PROPIONATE 50 MCG/ACT NA SUSP: 1.0000 | Freq: Every day | NASAL | Status: DC

## 2015-04-22 MED ORDER — FLUOXETINE HCL 20 MG PO TABS: 20.0000 mg | ORAL_TABLET | Freq: Every day | ORAL | Status: DC

## 2015-04-22 NOTE — Patient Instructions (Signed)

## 2015-04-22 NOTE — Progress Notes (Signed)
ASSESSMENT: Pt currently experiencing symptoms of anxiety and depression. Pt needs to f/u with PCP and [facility], as well as establishing care with psychiatry; would benefit from motivational interviewing to cope with symptoms of anxiety and depression.  Stage of Change: precontemplative  PLAN: 1. F/U with behavioral health consultant in two weeks 2. Psychiatric Medications: Prozac, Ativan 3. Behavioral recommendation(s):   -Go to [facility] for psychiatry BH med management walk -in clinic  SUBJECTIVE: Pt. referred by Dr Venetia Night for symptoms of anxiety and depression:  Pt. reports the following symptoms/concerns: Pt states that she is unable to work because she feels sick, cannot breathe through her nose, and has difficulty moving around and/or getting out of bed when she is not working. Pt says her primary concern is getting medication to help her to sleep; nothing has helped with anxiety and depression except for medication. Pt denies SI, says she has no plan, no attempts. Duration of problem: At least 4 months Severity: severe  OBJECTIVE: Orientation & Cognition: Oriented x3. Thought processes normal and appropriate to situation. Mood: teary. Affect: appropriate Appearance: appropriate Risk of harm to self or others: no known risk of harm to self or others Substance use: tobacco Assessments administered: PHQ9: 27/ GAD7: 21  Diagnosis: Anxiety and depression CPT Code: F41.9 -------------------------------------------- Other(s) present in the room: none  Time spent with patient in exam room: 20 minutes, 11:30-11:50am    Depression screen [facility] 2/9 04/22/2015 04/01/2015  Decreased Interest 3 3  Down, Depressed, Hopeless 3 3  PHQ - 2 Score 6 6  Altered sleeping 3 3  Tired, decreased energy 3 3  Change in appetite 3 3  Feeling bad or failure about yourself  3 3  Trouble concentrating 3 3  Moving slowly or fidgety/restless 3 3  Suicidal thoughts 3 3  PHQ-9 Score 27 27         GAD 7 :  Generalized Anxiety Score 04/01/2015  Nervous, Anxious, on Edge 3  Control/stop worrying 3  Worry too much - different things 3  Trouble relaxing 3  Restless 3  Easily annoyed or irritable 3  Afraid - awful might happen 3  Total GAD 7 Score 21

## 2015-04-22 NOTE — Progress Notes (Signed)
Subjective:  Patient ID: Jessica Shea, female    DOB: 1970/07/27  Age: 45 y.o. MRN: [id number]  CC: Follow-up   HPI Jessica Shea is a 44 year old female with a history of Depression and Anxiety who presents with complaints of recurrent sinusitis.  She was treated with Augmentin 3 weeks ago and complains symptoms improved on antibiotics but returned once course was completed. She complains of post nasal drip, sore throat, cough, sinus tenderness, clogged ears, headaches and a subjective fever and complains symptoms have been intermittent for the last six weeks.  She was prescribed Zoloft and Lorazepam at her last visit by the PA and referred to [facility] for mental care but she never went. She is requesting Prozac instead.  Seen at the ED 2 weeks ago for right leg pain and venous doppler was negative for DVT but revealed a large hematoma; she continues to complain of pain in her right leg.  Outpatient Prescriptions Prior to Visit  Medication Sig Dispense Refill  . amoxicillin-clavulanate (AUGMENTIN) 875-125 MG tablet Take 1 tablet by mouth 2 (two) times daily. 20 tablet 0  . benzonatate (TESSALON) 100 MG capsule Take 1 capsule (100 mg total) by mouth 2 (two) times daily as needed for cough. 20 capsule 0  . fish oil-omega-3 fatty acids 1000 MG capsule Take 1 g by mouth daily. Reported on 04/01/2015    . HYDROcodone-acetaminophen (NORCO) 7.5-325 MG per tablet Take 1 tablet by mouth every 4 (four) hours as needed. Reported on 04/01/2015    . HYDROcodone-homatropine (HYCODAN) 5-1.5 MG/5ML syrup Take 5 mLs by mouth every 8 (eight) hours as needed for cough. 120 mL 0  . LORazepam (ATIVAN) 1 MG tablet Take 1 tablet (1 mg total) by mouth every 8 (eight) hours. Reported on 04/01/2015 30 tablet 0  . naproxen sodium (ANAPROX) 220 MG tablet Take 660 mg by mouth daily as needed.      . Pseudoeph-Doxylamine-DM-APAP (NYQUIL) 60-7.08-01-998 MG/30ML LIQD Take 30 mLs by mouth at bedtime as needed. For  cough/congestion     . traMADol (ULTRAM) 50 MG tablet Take 50 mg by mouth every 6 (six) hours as needed. Reported on 04/01/2015    . albuterol (PROVENTIL HFA;VENTOLIN HFA) 108 (90 BASE) MCG/ACT inhaler Inhale 1-2 puffs into the lungs every 6 (six) hours as needed for wheezing. 1 Inhaler 0  . sertraline (ZOLOFT) 50 MG tablet Take 1 tablet (50 mg total) by mouth daily. (Patient not taking: Reported on 04/22/2015) 30 tablet 3   No facility-administered medications prior to visit.    ROS Review of Systems  Constitutional: Negative for activity change, appetite change and fatigue.  HENT: Positive for postnasal drip and sore throat. Negative for congestion and sinus pressure.   Eyes: Negative for visual disturbance.  Respiratory: Positive for cough. Negative for chest tightness, shortness of breath and wheezing.   Cardiovascular: Negative for chest pain and palpitations.  Gastrointestinal: Negative for abdominal pain, constipation and abdominal distention.  Endocrine: Negative for polydipsia.  Genitourinary: Negative for dysuria and frequency.  Musculoskeletal: Negative for back pain and arthralgias.       Right leg pain  Skin: Negative for rash.  Neurological: Negative for tremors, light-headedness and numbness.  Hematological: Does not bruise/bleed easily.  Psychiatric/Behavioral: Negative for behavioral problems and agitation.    Objective:  BP 102/71 mmHg  Pulse 62  Temp(Src) 97.9 F (36.6 C)  Resp 15  Ht  (1.727 m)  Wt 183 lb 9.6 oz (83.28 kg)  BMI 27.92  kg/m2  SpO2 100%  LMP 03/07/2015  BP/Weight 04/22/2015 04/10/2015 04/01/2015  Systolic BP 102 97 117  Diastolic BP 71 53 72  Wt. (Lbs) 183.6 - 185  BMI 27.92 - 28.14      Physical Exam  Constitutional: She is oriented to person, place, and time. She appears well-developed and well-nourished.  HENT:  Right Ear: External ear normal.  Left Ear: External ear normal.  Nose: Right sinus exhibits maxillary sinus tenderness  and frontal sinus tenderness. Left sinus exhibits maxillary sinus tenderness and frontal sinus tenderness.  Mouth/Throat: No oropharyngeal exudate (mucus in oropharynx, mild erythema).  Cardiovascular: Normal rate, normal heart sounds and intact distal pulses.   No murmur heard. Pulmonary/Chest: Effort normal and breath sounds normal. She has no wheezes. She has no rales. She exhibits no tenderness.  Abdominal: Soft. Bowel sounds are normal. She exhibits no distension and no mass. There is no tenderness.  Musculoskeletal: Normal range of motion. She exhibits edema (right calf edema) and tenderness (tenderness on palpation of right calf).  Neurological: She is alert and oriented to person, place, and time.   CLINICAL DATA: Fall 6 days ago. Pain in the right knee.  EXAM: RIGHT LOWER EXTREMITY VENOUS DOPPLER ULTRASOUND  TECHNIQUE: Gray-scale sonography with graded compression, as well as color Doppler and duplex ultrasound, were performed to evaluate the deep venous system from the level of the common femoral vein through the popliteal and proximal calf veins. Spectral Doppler was utilized to evaluate flow at rest and with distal augmentation maneuvers.  COMPARISON: None.  FINDINGS: Left common femoral vein is patent without thrombus.  Normal compressibility, augmentation and color Doppler flow in the right common femoral vein, right femoral vein and right popliteal vein. The right saphenofemoral junction is patent. Right profunda femoral vein is patent without thrombus. Visualized right deep calf veins are patent without thrombus. Visualized right great saphenous vein is compressible without thrombus.  There is a large complex collection along the medial aspect of the right knee. This structure measures 14.4 x 8.6 x 3.3 cm. Majority of this collection is heterogeneous but there some fluid or cystic components. There is no blood flow within this structure. Findings are  suggestive for a large hematoma based on history of recent injury.  IMPRESSION: Negative for deep venous thrombosis in right lower extremity.  Large complex collection along the medial right knee. This structure measures up to 14.4 cm and probably represents a large hematoma.   Electronically Signed  By: Richarda Overlie M.D.  On: 04/10/2015 16:01   Assessment & Plan:   1. Diabetes mellitus screening A1c 5.2 - HgB A1c  2. Acute recurrent ethmoidal sinusitis Failed recent treatment with Amoxicillin - amoxicillin (AMOXIL) 500 MG capsule; Take 1 capsule (500 mg total) by mouth 3 (three) times daily.  Dispense: 30 capsule; Refill: 0 - predniSONE (DELTASONE) 20 MG tablet; Take 1 tablet (20 mg total) by mouth daily with breakfast.  Dispense: 5 tablet; Refill: 0 - fluticasone (FLONASE) 50 MCG/ACT nasal spray; Place 1 spray into both nostrils daily.  Dispense: 16 g; Refill: 0  3. Sore throat - Culture, Group A Strep  4. Hematoma This could explain pain in her right calf Advised to apply heat  5. Depression with anxiety Refilled Prozac and she has been referred to mental health at [facility] ordered this encounter  Medications  . FLUoxetine (PROZAC) 20 MG tablet    Sig: Take 1 tablet (20 mg total) by mouth daily.    Dispense:  30 tablet    Refill:  1  . amoxicillin (AMOXIL) 500 MG capsule    Sig: Take 1 capsule (500 mg total) by mouth 3 (three) times daily.    Dispense:  30 capsule    Refill:  0  . predniSONE (DELTASONE) 20 MG tablet    Sig: Take 1 tablet (20 mg total) by mouth daily with breakfast.    Dispense:  5 tablet    Refill:  0  . fluticasone (FLONASE) 50 MCG/ACT nasal spray    Sig: Place 1 spray into both nostrils daily.    Dispense:  16 g    Refill:  0    Follow-up: No Follow-up on file.   Jaclyn Shaggy MD

## 2015-04-22 NOTE — Progress Notes (Signed)
Patient states she has a hernia from coughing so much She says she has a sinus infections and needs medication Her right calf is edematous She used to take prozac and is asking for refill

## 2015-04-24 ENCOUNTER — Emergency Department (HOSPITAL_COMMUNITY)
Admission: EM | Admit: 2015-04-24 | Discharge: 2015-04-24 | Disposition: A | Discharge status: Home / Self Care | Payer: Self-pay | Attending: Emergency Medicine | Admitting: Emergency Medicine

## 2015-04-24 ENCOUNTER — Encounter (HOSPITAL_COMMUNITY): Payer: Self-pay | Admitting: Emergency Medicine

## 2015-04-24 DIAGNOSIS — Z7951 Long term (current) use of inhaled steroids: Secondary | ICD-10-CM | POA: Insufficient documentation

## 2015-04-24 DIAGNOSIS — F17210 Nicotine dependence, cigarettes, uncomplicated: Secondary | ICD-10-CM | POA: Insufficient documentation

## 2015-04-24 DIAGNOSIS — Z8639 Personal history of other endocrine, nutritional and metabolic disease: Secondary | ICD-10-CM | POA: Insufficient documentation

## 2015-04-24 DIAGNOSIS — Z792 Long term (current) use of antibiotics: Secondary | ICD-10-CM | POA: Insufficient documentation

## 2015-04-24 DIAGNOSIS — Z7952 Long term (current) use of systemic steroids: Secondary | ICD-10-CM | POA: Insufficient documentation

## 2015-04-24 DIAGNOSIS — Z862 Personal history of diseases of the blood and blood-forming organs and certain disorders involving the immune mechanism: Secondary | ICD-10-CM | POA: Insufficient documentation

## 2015-04-24 DIAGNOSIS — Z79899 Other long term (current) drug therapy: Secondary | ICD-10-CM | POA: Insufficient documentation

## 2015-04-24 DIAGNOSIS — J029 Acute pharyngitis, unspecified: Secondary | ICD-10-CM | POA: Insufficient documentation

## 2015-04-24 LAB — CULTURE, GROUP A STREP: ORGANISM ID, BACTERIA: NORMAL

## 2015-04-24 LAB — RAPID STREP SCREEN (MED CTR MEBANE ONLY): Streptococcus, Group A Screen (Direct): NEGATIVE

## 2015-04-24 MED ORDER — SUCRALFATE 1 GM/10ML PO SUSP: 1.0000 g | Freq: Three times a day (TID) | ORAL | Status: DC

## 2015-04-24 MED ORDER — DEXAMETHASONE SODIUM PHOSPHATE 10 MG/ML IJ SOLN
10.0000 mg | Freq: Once | INTRAMUSCULAR | Status: AC
  Administered 2015-04-24: 10 mg via INTRAMUSCULAR
  Filled 2015-04-24: qty 1

## 2015-04-24 MED ORDER — IBUPROFEN 800 MG PO TABS
800.0000 mg | ORAL_TABLET | Freq: Once | ORAL | Status: DC
  Filled 2015-04-24: qty 1

## 2015-04-24 NOTE — Discharge Instructions (Signed)
Faringitis  (Pharyngitis)  La faringitis ocurre cuando la faringe presenta enrojecimiento, dolor e hinchazón (inflamación).   CAUSAS   Normalmente, la faringitis se debe a una infección. Generalmente, estas infecciones ocurren debido a virus (viral) y se presentan cuando las personas se resfrían. Sin embargo, a veces la faringitis es provocada por bacterias (bacteriana). Las alergias también pueden ser una causa de la faringitis. La faringitis viral se puede contagiar de una persona a otra al toser, estornudar y compartir objetos o utensilios personales (tazas, tenedores, cucharas, cepillos de diente). La faringitis bacteriana se puede contagiar de una persona a otra a través de un contacto más íntimo, como besar.   SIGNOS Y SÍNTOMAS   Los síntomas de la faringitis incluyen los siguientes:   · Dolor de garganta.  · Cansancio (fatiga).  · Fiebre no muy elevada.  · Dolor de cabeza.  · Dolores musculares y en las articulaciones.  · Erupciones cutáneas  · Ganglios linfáticos hinchados.  · Una película parecida a las placas en la garganta o las amígdalas (frecuente con la faringitis bacteriana).  DIAGNÓSTICO   El médico le hará preguntas sobre la enfermedad y sus síntomas. Normalmente, todo lo que se necesita para diagnosticar una faringitis son sus antecedentes médicos y un examen físico. A veces se realiza una prueba rápida para estreptococos. También es posible que se realicen otros análisis de laboratorio, según la posible causa.   TRATAMIENTO   La faringitis viral normalmente mejorará en un plazo de 3 a 4 días sin medicamentos. La faringitis bacteriana se trata con medicamentos que matan los gérmenes (antibióticos).   INSTRUCCIONES PARA EL CUIDADO EN EL HOGAR   · Beba gran cantidad de líquido para mantener la orina de tono claro o color amarillo pálido.  · Tome solo medicamentos de venta libre o recetados, según las indicaciones del médico.    Si le receta antibióticos, asegúrese de terminarlos, incluso si comienza  a sentirse mejor.    No tome aspirina.  · Descanse lo suficiente.  · Hágase gárgaras con 8 onzas (227 ml) de agua con sal (½ cucharadita de sal por litro de agua) cada 1 o 2 horas para calmar la garganta.  · Puede usar pastillas (si no corre riesgo de ahogarse) o aerosoles para calmar la garganta.  SOLICITE ATENCIÓN MÉDICA SI:   · Tiene bultos grandes y dolorosos en el cuello.  · Tiene una erupción cutánea.  · Cuando tose elimina una expectoración verde, amarillo amarronado o con sangre.  SOLICITE ATENCIÓN MÉDICA DE INMEDIATO SI:   · El cuello se pone rígido.  · Comienza a babear o no puede tragar líquidos.  · Vomita o no puede retener los medicamentos ni los líquidos.  · Siente un dolor intenso que no se alivia con los medicamentos recomendados.  · Tiene dificultades para respirar (y no debido a la nariz tapada).  ASEGÚRESE DE QUE:   · Comprende estas instrucciones.  · Controlará su afección.  · Recibirá ayuda de inmediato si no mejora o si empeora.     Esta información no tiene como fin reemplazar el consejo del médico. Asegúrese de hacerle al médico cualquier pregunta que tenga.     Document Released: 11/29/2004 Document Revised: 12/10/2012  Elsevier Interactive Patient Education ©2016 [employer]

## 2015-04-24 NOTE — ED Notes (Signed)
Pt c/o sore throat and sinus pressure x 3 months.

## 2015-04-24 NOTE — ED Provider Notes (Signed)
CSN: [id number]     Arrival date & time 04/24/15  0440 History   First MD Initiated Contact with Patient 04/24/15 0518     Chief Complaint  Patient presents with  . Sore Throat     (Consider location/radiation/quality/duration/timing/severity/associated sxs/prior Treatment) Patient is a 45 y.o. female presenting with pharyngitis. The history is provided by the patient.  Sore Throat This is a chronic problem. The current episode started more than 1 week ago. The problem occurs constantly. The problem has not changed since onset.Pertinent negatives include no chest pain, no abdominal pain, no headaches and no shortness of breath. Nothing aggravates the symptoms. Nothing relieves the symptoms. She has tried nothing for the symptoms. The treatment provided no relief.    Past Medical History  Diagnosis Date  . Thyroid disease   . Left thyroid nodule 2012  . Sinus bradycardia 2012  . Anemia   . Dyspnea 2012   History reviewed. No pertinent past surgical history. No family history on file. Social History  Substance Use Topics  . Smoking status: Current Every Day Smoker -- 0.25 packs/day    Types: Cigarettes  . Smokeless tobacco: None  . Alcohol Use: No   OB History    No data available     Review of Systems  Respiratory: Negative for shortness of breath.   Cardiovascular: Negative for chest pain.  Gastrointestinal: Negative for abdominal pain.  Neurological: Negative for headaches.  All other systems reviewed and are negative.     Allergies  Review of patient's allergies indicates no known allergies.  Home Medications   Prior to Admission medications   Medication Sig Start Date End Date Taking? Authorizing Provider  albuterol (PROVENTIL HFA;VENTOLIN HFA) 108 (90 BASE) MCG/ACT inhaler Inhale 1-2 puffs into the lungs every 6 (six) hours as needed for wheezing. 01/11/11 01/11/12  Devoria Albe, MD  amoxicillin (AMOXIL) 500 MG capsule Take 1 capsule (500 mg total) by mouth 3  (three) times daily. 04/22/15   Jaclyn Shaggy, MD  amoxicillin-clavulanate (AUGMENTIN) 875-125 MG tablet Take 1 tablet by mouth 2 (two) times daily. 04/01/15   Tiffany Netta Cedars, PA-C  benzonatate (TESSALON) 100 MG capsule Take 1 capsule (100 mg total) by mouth 2 (two) times daily as needed for cough. 04/01/15   Vivianne Master, PA-C  fish oil-omega-3 fatty acids 1000 MG capsule Take 1 g by mouth daily. Reported on 04/01/2015    Historical Provider, MD  FLUoxetine (PROZAC) 20 MG tablet Take 1 tablet (20 mg total) by mouth daily. 04/22/15   Jaclyn Shaggy, MD  fluticasone (FLONASE) 50 MCG/ACT nasal spray Place 1 spray into both nostrils daily. 04/22/15   Jaclyn Shaggy, MD  HYDROcodone-acetaminophen (NORCO) 7.5-325 MG per tablet Take 1 tablet by mouth every 4 (four) hours as needed. Reported on 04/01/2015    Historical Provider, MD  HYDROcodone-homatropine [facility]) 5-1.5 MG/5ML syrup Take 5 mLs by mouth every 8 (eight) hours as needed for cough. 04/01/15   Tiffany Netta Cedars, PA-C  LORazepam (ATIVAN) 1 MG tablet Take 1 tablet (1 mg total) by mouth every 8 (eight) hours. Reported on 04/01/2015 04/01/15   Vivianne Master, PA-C  naproxen sodium (ANAPROX) 220 MG tablet Take 660 mg by mouth daily as needed.      Historical Provider, MD  predniSONE (DELTASONE) 20 MG tablet Take 1 tablet (20 mg total) by mouth daily with breakfast. 04/22/15   Jaclyn Shaggy, MD  Pseudoeph-Doxylamine-DM-APAP (NYQUIL) 60-7.08-01-998 MG/30ML LIQD Take 30 mLs by mouth at bedtime as needed.  For cough/congestion     Historical Provider, MD  traMADol (ULTRAM) 50 MG tablet Take 50 mg by mouth every 6 (six) hours as needed. Reported on 04/01/2015    Historical Provider, MD   BP 112/56 mmHg  Pulse 69  Temp(Src) 98.3 F (36.8 C) (Oral)  Resp 18  Ht  (1.676 m)  Wt 185 lb (83.915 kg)  BMI 29.87 kg/m2  SpO2 99%  LMP 04/02/2015 Physical Exam  Constitutional: She is oriented to person, place, and time. She appears well-developed and well-nourished. No  distress.  HENT:  Head: Normocephalic and atraumatic.  Mouth/Throat: Oropharynx is clear and moist. No oropharyngeal exudate.  Eyes: Conjunctivae and EOM are normal. Pupils are equal, round, and reactive to light.  Neck: Normal range of motion. Neck supple.  Cardiovascular: Normal rate, regular rhythm and intact distal pulses.   Pulmonary/Chest: Effort normal and breath sounds normal. No respiratory distress. She has no wheezes. She has no rales.  Abdominal: Soft. Bowel sounds are normal. There is no tenderness. There is no rebound and no guarding.  Musculoskeletal: Normal range of motion.  Lymphadenopathy:    She has no cervical adenopathy.  Neurological: She is alert and oriented to person, place, and time.  Skin: Skin is warm and dry.  Psychiatric: She has a normal mood and affect.    ED Course  Procedures (including critical care time) Labs Review Labs Reviewed  RAPID STREP SCREEN (NOT AT [facility])  CULTURE, GROUP A STREP [facility])    Imaging Review No results found. I have personally reviewed and evaluated these images and lab results as part of my medical decision-making.   EKG Interpretation None      MDM   Final diagnoses:  None    No strep, based on [facility] no indication for further testing or treatment    Kathlynn Swofford, MD 04/24/15 [id number]

## 2015-04-25 ENCOUNTER — Telehealth: Payer: Self-pay | Admitting: *Deleted

## 2015-04-25 NOTE — Telephone Encounter (Signed)
-----   Message from Jaclyn Shaggy, MD sent at 04/25/2015  9:07 AM EST ----- Please inform the patient that labs are normal. Thank you.

## 2015-04-25 NOTE — Telephone Encounter (Signed)
Left HIPAA compliant message for patient to return my call. 

## 2015-04-26 LAB — CULTURE, GROUP A STREP (THRC)

## 2015-04-28 ENCOUNTER — Emergency Department (HOSPITAL_COMMUNITY)
Admission: EM | Admit: 2015-04-28 | Discharge: 2015-04-29 | Disposition: A | Discharge status: Other Acute Inpatient Hospital | Payer: Self-pay | Attending: Emergency Medicine | Admitting: Emergency Medicine

## 2015-04-28 ENCOUNTER — Encounter (HOSPITAL_COMMUNITY): Payer: Self-pay | Admitting: Emergency Medicine

## 2015-04-28 DIAGNOSIS — Z8639 Personal history of other endocrine, nutritional and metabolic disease: Secondary | ICD-10-CM | POA: Insufficient documentation

## 2015-04-28 DIAGNOSIS — F329 Major depressive disorder, single episode, unspecified: Secondary | ICD-10-CM | POA: Insufficient documentation

## 2015-04-28 DIAGNOSIS — Z3202 Encounter for pregnancy test, result negative: Secondary | ICD-10-CM | POA: Insufficient documentation

## 2015-04-28 DIAGNOSIS — Z79899 Other long term (current) drug therapy: Secondary | ICD-10-CM | POA: Insufficient documentation

## 2015-04-28 DIAGNOSIS — R45851 Suicidal ideations: Secondary | ICD-10-CM

## 2015-04-28 DIAGNOSIS — Z862 Personal history of diseases of the blood and blood-forming organs and certain disorders involving the immune mechanism: Secondary | ICD-10-CM | POA: Insufficient documentation

## 2015-04-28 DIAGNOSIS — F17210 Nicotine dependence, cigarettes, uncomplicated: Secondary | ICD-10-CM | POA: Insufficient documentation

## 2015-04-28 DIAGNOSIS — Z7951 Long term (current) use of inhaled steroids: Secondary | ICD-10-CM | POA: Insufficient documentation

## 2015-04-28 DIAGNOSIS — Z792 Long term (current) use of antibiotics: Secondary | ICD-10-CM | POA: Insufficient documentation

## 2015-04-28 LAB — CBC
HEMATOCRIT: 28.1 % — AB (ref 36.0–46.0)
HEMOGLOBIN: 8 g/dL — AB (ref 12.0–15.0)
MCH: 20.2 pg — AB (ref 26.0–34.0)
MCHC: 28.5 g/dL — AB (ref 30.0–36.0)
MCV: 70.8 fL — ABNORMAL LOW (ref 78.0–100.0)
Platelets: 417 10*3/uL — ABNORMAL HIGH (ref 150–400)
RBC: 3.97 MIL/uL (ref 3.87–5.11)
RDW: 18.2 % — ABNORMAL HIGH (ref 11.5–15.5)
WBC: 5.8 10*3/uL (ref 4.0–10.5)

## 2015-04-28 LAB — COMPREHENSIVE METABOLIC PANEL
ALBUMIN: 4.1 g/dL (ref 3.5–5.0)
ALK PHOS: 48 U/L (ref 38–126)
ALT: 22 U/L (ref 14–54)
AST: 24 U/L (ref 15–41)
Anion gap: 8 (ref 5–15)
BUN: 10 mg/dL (ref 6–20)
CALCIUM: 8.6 mg/dL — AB (ref 8.9–10.3)
CHLORIDE: 104 mmol/L (ref 101–111)
CO2: 25 mmol/L (ref 22–32)
CREATININE: 0.7 mg/dL (ref 0.44–1.00)
GFR calc Af Amer: >60 mL/min (ref >60)
GFR calc non Af Amer: >60 mL/min (ref >60)
GLUCOSE: 101 mg/dL — AB (ref 65–99)
Potassium: 3.1 mmol/L — ABNORMAL LOW (ref 3.5–5.1)
SODIUM: 137 mmol/L (ref 135–145)
Total Bilirubin: 0.5 mg/dL (ref 0.3–1.2)
Total Protein: 7.3 g/dL (ref 6.5–8.1)

## 2015-04-28 LAB — RAPID URINE DRUG SCREEN, HOSP PERFORMED
AMPHETAMINES: NOT DETECTED
BARBITURATES: NOT DETECTED
Benzodiazepines: NOT DETECTED
COCAINE: NOT DETECTED
OPIATES: NOT DETECTED
TETRAHYDROCANNABINOL: NOT DETECTED

## 2015-04-28 LAB — I-STAT BETA HCG BLOOD, ED (MC, WL, AP ONLY): I-stat hCG, quantitative: <5 m[IU]/mL (ref <5)

## 2015-04-28 LAB — SALICYLATE LEVEL

## 2015-04-28 LAB — ACETAMINOPHEN LEVEL: Acetaminophen (Tylenol), Serum: <10 ug/mL — ABNORMAL LOW (ref 10–30)

## 2015-04-28 LAB — ETHANOL: Alcohol, Ethyl (B): <5 mg/dL (ref <5)

## 2015-04-28 MED ORDER — FLUOXETINE HCL 20 MG PO TABS: 20.0000 mg | ORAL_TABLET | Freq: Every day | ORAL | Status: DC

## 2015-04-28 MED ORDER — ALUM & MAG HYDROXIDE-SIMETH 200-200-20 MG/5ML PO SUSP: 30.0000 mL | ORAL | Status: DC | PRN

## 2015-04-28 MED ORDER — ZOLPIDEM TARTRATE 5 MG PO TABS
5.0000 mg | ORAL_TABLET | Freq: Every evening | ORAL | Status: DC | PRN
  Administered 2015-04-28: 5 mg via ORAL
  Filled 2015-04-28: qty 1

## 2015-04-28 MED ORDER — LORAZEPAM 1 MG PO TABS
1.0000 mg | ORAL_TABLET | Freq: Three times a day (TID) | ORAL | Status: DC
  Administered 2015-04-28 (×2): 1 mg via ORAL
  Filled 2015-04-28 (×2): qty 1

## 2015-04-28 MED ORDER — NICOTINE 21 MG/24HR TD PT24
21.0000 mg | MEDICATED_PATCH | Freq: Every day | TRANSDERMAL | Status: DC
  Administered 2015-04-28: 21 mg via TRANSDERMAL
  Filled 2015-04-28: qty 1

## 2015-04-28 MED ORDER — ONDANSETRON HCL 4 MG PO TABS: 4.0000 mg | ORAL_TABLET | Freq: Three times a day (TID) | ORAL | Status: DC | PRN

## 2015-04-28 MED ORDER — FLUTICASONE PROPIONATE 50 MCG/ACT NA SUSP
2.0000 | Freq: Every day | NASAL | Status: DC
  Administered 2015-04-28: 2 via NASAL
  Filled 2015-04-28: qty 16

## 2015-04-28 MED ORDER — ACETAMINOPHEN 325 MG PO TABS: 650.0000 mg | ORAL_TABLET | ORAL | Status: DC | PRN

## 2015-04-28 MED ORDER — IBUPROFEN 200 MG PO TABS: 600.0000 mg | ORAL_TABLET | Freq: Three times a day (TID) | ORAL | Status: DC | PRN

## 2015-04-28 NOTE — Progress Notes (Signed)
Patient accepted at [facility], to Dr. Jama Flavors, to [phone]. RN Rashell aware.  Melbourne Abts, LCSWA Disposition staff 04/28/2015 10:50 PM

## 2015-04-28 NOTE — BH Assessment (Signed)
Per Dr. Michae Kava patient meets criteria for inpatient hospitalization.

## 2015-04-28 NOTE — ED Provider Notes (Signed)
CSN: [id number]     Arrival date & time 04/28/15  1218 History   First MD Initiated Contact with Patient 04/28/15 1515     Chief Complaint  Patient presents with  . IVC      (Consider location/radiation/quality/duration/timing/severity/associated sxs/prior Treatment) HPI Patient presents to the emergency department with depression, anxiety, with worsening ataxia and a plan to overdose on medications.  Patient denies chest pain, shortness of breath, weakness, dizziness, headache, blurred vision, back pain, neck, fever, dysuria, incontinence, bloody stool, hematemesis, patient states that nothing seems make her condition better or worse.  She states she has taken an overdose in the past.  She denies any homicidal ideation.  She also does not hallucinations Past Medical History  Diagnosis Date  . Thyroid disease   . Left thyroid nodule 2012  . Sinus bradycardia 2012  . Anemia   . Dyspnea 2012   History reviewed. No pertinent past surgical history. History reviewed. No pertinent family history. Social History  Substance Use Topics  . Smoking status: Current Every Day Smoker -- 0.25 packs/day    Types: Cigarettes  . Smokeless tobacco: None  . Alcohol Use: No   OB History    No data available     Review of Systems All other systems negative except as documented in the HPI. All pertinent positives and negatives as reviewed in the HPI.  Allergies  Review of patient's allergies indicates no known allergies.  Home Medications   Prior to Admission medications   Medication Sig Start Date End Date Taking? Authorizing Provider  albuterol (PROVENTIL HFA;VENTOLIN HFA) 108 (90 BASE) MCG/ACT inhaler Inhale 1-2 puffs into the lungs every 6 (six) hours as needed for wheezing. 01/11/11 04/28/15 Yes Devoria Albe, MD  amoxicillin (AMOXIL) 500 MG capsule Take 1 capsule (500 mg total) by mouth 3 (three) times daily. 04/22/15  Yes Jaclyn Shaggy, MD  FLUoxetine (PROZAC) 20 MG tablet Take 1 tablet (20 mg  total) by mouth daily. 04/22/15  Yes Jaclyn Shaggy, MD  fluticasone (FLONASE) 50 MCG/ACT nasal spray Place 1 spray into both nostrils daily. Patient taking differently: Place 1 spray into both nostrils daily as needed for allergies.  04/22/15  Yes Jaclyn Shaggy, MD  LORazepam (ATIVAN) 1 MG tablet Take 1 tablet (1 mg total) by mouth every 8 (eight) hours. Reported on 04/01/2015 04/01/15  Yes Tiffany Netta Cedars, PA-C  naproxen sodium (ANAPROX) 220 MG tablet Take 660 mg by mouth daily as needed (pain).    Yes Historical Provider, MD  amoxicillin-clavulanate (AUGMENTIN) 875-125 MG tablet Take 1 tablet by mouth 2 (two) times daily. Patient not taking: Reported on 04/28/2015 04/01/15   Vivianne Master, PA-C  benzonatate (TESSALON) 100 MG capsule Take 1 capsule (100 mg total) by mouth 2 (two) times daily as needed for cough. Patient not taking: Reported on 04/28/2015 04/01/15   Vivianne Master, PA-C  HYDROcodone-homatropine [facility]) 5-1.5 MG/5ML syrup Take 5 mLs by mouth every 8 (eight) hours as needed for cough. Patient not taking: Reported on 04/28/2015 04/01/15   Vivianne Master, PA-C  predniSONE (DELTASONE) 20 MG tablet Take 1 tablet (20 mg total) by mouth daily with breakfast. Patient not taking: Reported on 04/28/2015 04/22/15   Jaclyn Shaggy, MD  sucralfate (CARAFATE) 1 GM/10ML suspension Take 10 mLs (1 g total) by mouth 4 (four) times daily -  with meals and at bedtime. Patient not taking: Reported on 04/28/2015 04/24/15   April Palumbo, MD   BP 104/66 mmHg  Pulse 56  Temp(Src)  98.1 F (36.7 C) (Oral)  Resp 18  SpO2 98%  LMP 04/02/2015 Physical Exam  Constitutional: She is oriented to person, place, and time. She appears well-developed and well-nourished. No distress.  HENT:  Head: Normocephalic and atraumatic.  Mouth/Throat: Oropharynx is clear and moist.  Eyes: Pupils are equal, round, and reactive to light.  Neck: Normal range of motion. Neck supple.  Cardiovascular: Normal rate, regular rhythm and normal  heart sounds.  Exam reveals no gallop and no friction rub.   No murmur heard. Pulmonary/Chest: Effort normal and breath sounds normal. No respiratory distress. She has no wheezes.  Neurological: She is alert and oriented to person, place, and time. She exhibits normal muscle tone. Coordination normal.  Skin: Skin is warm and dry. No rash noted. No erythema.  Psychiatric: She has a normal mood and affect. Her behavior is normal.  Nursing note and vitals reviewed.   ED Course  Procedures (including critical care time) Labs Review Labs Reviewed  COMPREHENSIVE METABOLIC PANEL - Abnormal; Notable for the following:    Potassium 3.1 (*)    Glucose, Bld 101 (*)    Calcium 8.6 (*)    All other components within normal limits  ACETAMINOPHEN LEVEL - Abnormal; Notable for the following:    Acetaminophen (Tylenol), Serum <10 (*)    All other components within normal limits  CBC - Abnormal; Notable for the following:    Hemoglobin 8.0 (*)    HCT 28.1 (*)    MCV 70.8 (*)    MCH 20.2 (*)    MCHC 28.5 (*)    RDW 18.2 (*)    Platelets 417 (*)    All other components within normal limits  ETHANOL  SALICYLATE LEVEL  URINE RAPID DRUG SCREEN, HOSP PERFORMED  I-STAT BETA HCG BLOOD, ED ([facility], [facility], [facility] ONLY)    Imaging Review No results found. I have personally reviewed and evaluated these images and lab results as part of my medical decision-making.   EKG Interpretation None      The patient will need TTS assessment for her depression, anxiety, and suicidal ideation    Charlestine Night, PA-C 04/28/15 1617  Derwood Kaplan, MD 04/29/15 0040

## 2015-04-28 NOTE — BH Assessment (Addendum)
Assessment Note  Patient is a 45 year old [place] American female that was IVC by [employer].  Per IVC documentation the patient expressed SI with a plan to overdose on her sinus medication.   Patient reports that she only wanted to obtain some sinus medication.   Patient reports a history of depression and anxiety.  Patient reports that she was sent to ED so that the ED would give her some anxiety medication.   Per documentation in epic, patient has a history of intentional overdose at age 15 years old.  Documentation in the epic chart also reports that patient stating that she uses "a lot of OTC medications and she does not feel safe".    Documentation in the epic chart reports that the patient has a bed at [facility].    Patient denies HI/Psychosis/Substance Abuse.  Patient BAL <5 and UDS is negative.  Patient denies physical, sexual or emotional abuse.  Patient denies prior psychiatric hospitalizations.  Patient reports that she lives alone and her sister is a family support for her.    Disposition:  Per Dr. Michae Kava patient meets criteria for inpatient hospitalization.   Diagnosis: Major Depressive Disorder   Past Medical History:  Past Medical History  Diagnosis Date  . Thyroid disease   . Left thyroid nodule 2012  . Sinus bradycardia 2012  . Anemia   . Dyspnea 2012    History reviewed. No pertinent past surgical history.  Family History: History reviewed. No pertinent family history.  Social History:  reports that she has been smoking Cigarettes.  She has been smoking about 0.25 packs per day. She does not have any smokeless tobacco history on file. She reports that she does not drink alcohol or use illicit drugs.  Additional Social History:  Alcohol / Drug Use History of alcohol / drug use?: No history of alcohol / drug abuse  CIWA: CIWA-Ar BP: 104/66 mmHg Pulse Rate: (!) 56 COWS:    Allergies: No Known Allergies  Home Medications:  (Not in a hospital admission)  OB/GYN  Status:  Patient's last menstrual period was 04/02/2015.  General Assessment Data Location of Assessment: [facility] ED TTS Assessment: In system Is this a Tele or Face-to-Face Assessment?: Face-to-Face Is this an Initial Assessment or a Re-assessment for this encounter?: Initial Assessment Marital status: Single Maiden name: NA Is patient pregnant?: No Pregnancy Status: No Living Arrangements: Alone Can pt return to current living arrangement?: Yes Admission Status: Voluntary Is patient capable of signing voluntary admission?: Yes Referral Source: Self/Family/Friend Insurance type: Self Pay     Crisis Care Plan Living Arrangements: Alone Legal Guardian:  (NA) Name of Psychiatrist: [facility] Name of Therapist: [facility]  Education Status Is patient currently in school?: No Current Grade: NA Highest grade of school patient has completed: NA Name of school: NA Contact person: NA  Risk to self with the past 6 months Suicidal Ideation: No Has patient been a risk to self within the past 6 months prior to admission? : No Suicidal Intent: No Has patient had any suicidal intent within the past 6 months prior to admission? : No Is patient at risk for suicide?: No Suicidal Plan?: No Has patient had any suicidal plan within the past 6 months prior to admission? : No Access to Means: No What has been your use of drugs/alcohol within the last 12 months?: None Reported Previous Attempts/Gestures: No How many times?: 0 Other Self Harm Risks: None Reported Triggers for Past Attempts:  (None Reported) Intentional Self Injurious Behavior:  None Family Suicide History: No Recent stressful life event(s): Loss (Comment), Financial Problems Persecutory voices/beliefs?: No Depression: Yes Depression Symptoms: Despondent, Loss of interest in usual pleasures, Feeling worthless/self pity Substance abuse history and/or treatment for substance abuse?: No Suicide prevention information given to  non-admitted patients: Not applicable  Risk to Others within the past 6 months Homicidal Ideation: No Does patient have any lifetime risk of violence toward others beyond the six months prior to admission? : No Thoughts of Harm to Others: No Current Homicidal Intent: No Current Homicidal Plan: No Access to Homicidal Means: No Identified Victim: None Reported History of harm to others?: No Assessment of Violence: None Noted Violent Behavior Description: None Reported Does patient have access to weapons?: No Criminal Charges Pending?: No Does patient have a court date: No Is patient on probation?: No  Psychosis Hallucinations: None noted Delusions: None noted  Mental Status Report Appearance/Hygiene: Disheveled Eye Contact: Fair Motor Activity: Freedom of movement Speech: Logical/coherent Level of Consciousness: Alert Mood: Depressed, Anxious Affect: Anxious Anxiety Level: Minimal Thought Processes: Coherent, Relevant Judgement: Unimpaired Orientation: Person, Place, Time, Situation Obsessive Compulsive Thoughts/Behaviors: None  Cognitive Functioning Concentration: Decreased Memory: Recent Intact, Remote Intact IQ: Average Insight: Fair Impulse Control: Fair Appetite: Fair Weight Loss: 0 Weight Gain: 0 Sleep: Decreased Total Hours of Sleep: 2 Vegetative Symptoms: Decreased grooming, Staying in bed  ADLScreening [facility] Assessment Services) Patient's cognitive ability adequate to safely complete daily activities?: Yes Patient able to express need for assistance with ADLs?: Yes Independently performs ADLs?: Yes (appropriate for developmental age)  Prior Inpatient Therapy Prior Inpatient Therapy: No Prior Therapy Dates: NA Prior Therapy Facilty/Provider(s): NA Reason for Treatment: NA  Prior Outpatient Therapy Prior Outpatient Therapy: Yes Prior Therapy Dates: Ongoing  Prior Therapy Facilty/Provider(s): [facility] Reason for Treatment: Medication Management Does  patient have an ACCT team?: No Does patient have Intensive In-House Services?  : No Does patient have [facility] services? : Yes Does patient have P4CC services?: No  ADL Screening (condition at time of admission) Patient's cognitive ability adequate to safely complete daily activities?: Yes Is the patient deaf or have difficulty hearing?: No Does the patient have difficulty seeing, even when wearing glasses/contacts?: No Does the patient have difficulty concentrating, remembering, or making decisions?: No Patient able to express need for assistance with ADLs?: Yes Does the patient have difficulty dressing or bathing?: No Independently performs ADLs?: Yes (appropriate for developmental age) Does the patient have difficulty walking or climbing stairs?: No Weakness of Legs: None Weakness of Arms/Hands: None  Home Assistive Devices/Equipment Home Assistive Devices/Equipment: None    Abuse/Neglect Assessment (Assessment to be complete while patient is alone) Physical Abuse: Denies Verbal Abuse: Denies Sexual Abuse: Denies Exploitation of patient/patient's resources: Denies Self-Neglect: Denies Values / Beliefs Cultural Requests During Hospitalization: None Spiritual Requests During Hospitalization: None Consults Spiritual Care Consult Needed: No Social Work Consult Needed: No Merchant navy officer (For Healthcare) Does patient have an advance directive?: No Would patient like information on creating an advanced directive?: No - patient declined information    Additional Information 1:1 In Past 12 Months?: No CIRT Risk: No Elopement Risk: No Does patient have medical clearance?: No     Disposition: Disposition:  Per Dr. Michae Kava patient meets criteria for inpatient hospitalization.   Disposition Initial Assessment Completed for this Encounter: Yes  On Site Evaluation by:   Reviewed with Physician:    Phillip Heal LaVerne 04/28/2015 5:18 PM

## 2015-04-28 NOTE — Progress Notes (Addendum)
Entered in d/c instructions  Jessica Shea, Jessica Shea on 05/06/2015 You have an appointment for 2 week follow up care at [facility] and wellness at 1030 on 05/06/15 [address] [place] [address] [phone]  Pt with 4 ED visits Last Office visit to [facility] was on 04/22/15 for anxiety No BH visits listed in [facility]

## 2015-04-28 NOTE — ED Notes (Signed)
Spoke with [place] from [place] and states patient has been declined by [employer].  [employer] brought patient back after taking her to [facility]. Report to [place] in [facility], patient will go to 25, will call when ready for patient

## 2015-04-28 NOTE — ED Notes (Signed)
Patient has two bags of belongings in locker 26. 

## 2015-04-28 NOTE — ED Notes (Addendum)
IVC patient went to [facility] voluntarily today for depression, anxiety, anhedonia, worsening anxiety attacks, and SI with plan to overdose on sinus medications. No HI/AVH. Pt has history of intentional overdose at age 45. Reports using "a lot of OTC medications." States she does not feel safe. C/o abdominal pain and pain in right calf where she was told she had a DVT but not given any follow-up information. Nodule to thyroid, had thyroid biopsy years ago. [facility] states they have a bed.

## 2015-04-28 NOTE — Progress Notes (Signed)
Under review at [facility].  Melbourne Abts, LCSWA Disposition staff 04/28/2015 9:46 PM

## 2015-04-28 NOTE — ED Notes (Signed)
Called for transport

## 2015-04-28 NOTE — ED Notes (Signed)
Patient reports SI with a plan to over dose on sinus medication. Patient denies HI and AVH at this time. Patient oriented to unit. Plan of care discussed. Patient voices no complaints or concerns at this time. Encouragement and support provided and safety maintain. Q 15 min safety checks in place.

## 2015-04-29 ENCOUNTER — Inpatient Hospital Stay (HOSPITAL_COMMUNITY)
Admission: AD | Admit: 2015-04-29 | Discharge: 2015-05-05 | DRG: 885 | Disposition: A | Discharge status: Home / Self Care | Payer: Federal, State, Local not specified - Other | Attending: Psychiatry | Admitting: Psychiatry

## 2015-04-29 ENCOUNTER — Encounter (HOSPITAL_COMMUNITY): Payer: Self-pay | Admitting: Nurse Practitioner

## 2015-04-29 DIAGNOSIS — E876 Hypokalemia: Secondary | ICD-10-CM

## 2015-04-29 DIAGNOSIS — D509 Iron deficiency anemia, unspecified: Secondary | ICD-10-CM | POA: Diagnosis present

## 2015-04-29 DIAGNOSIS — F17210 Nicotine dependence, cigarettes, uncomplicated: Secondary | ICD-10-CM | POA: Diagnosis present

## 2015-04-29 DIAGNOSIS — S8011XS Contusion of right lower leg, sequela: Secondary | ICD-10-CM

## 2015-04-29 DIAGNOSIS — F332 Major depressive disorder, recurrent severe without psychotic features: Principal | ICD-10-CM

## 2015-04-29 DIAGNOSIS — F329 Major depressive disorder, single episode, unspecified: Secondary | ICD-10-CM | POA: Diagnosis present

## 2015-04-29 DIAGNOSIS — J329 Chronic sinusitis, unspecified: Secondary | ICD-10-CM | POA: Diagnosis present

## 2015-04-29 DIAGNOSIS — F418 Other specified anxiety disorders: Secondary | ICD-10-CM | POA: Diagnosis present

## 2015-04-29 HISTORY — DX: Acute ethmoidal sinusitis, unspecified: J01.20

## 2015-04-29 MED ORDER — POTASSIUM CHLORIDE CRYS ER 20 MEQ PO TBCR
20.0000 meq | EXTENDED_RELEASE_TABLET | Freq: Two times a day (BID) | ORAL | Status: AC
  Administered 2015-04-29 – 2015-04-30 (×3): 20 meq via ORAL
  Filled 2015-04-29 (×3): qty 1

## 2015-04-29 MED ORDER — IBUPROFEN 600 MG PO TABS
600.0000 mg | ORAL_TABLET | Freq: Four times a day (QID) | ORAL | Status: DC | PRN
  Administered 2015-04-30 – 2015-05-03 (×4): 600 mg via ORAL
  Filled 2015-04-29 (×4): qty 1

## 2015-04-29 MED ORDER — AMOXICILLIN 500 MG PO CAPS
500.0000 mg | ORAL_CAPSULE | Freq: Three times a day (TID) | ORAL | Status: DC
  Administered 2015-04-29: 500 mg via ORAL
  Filled 2015-04-29 (×8): qty 1

## 2015-04-29 MED ORDER — FLUTICASONE PROPIONATE 50 MCG/ACT NA SUSP
2.0000 | Freq: Every day | NASAL | Status: DC
  Administered 2015-04-30 – 2015-05-05 (×6): 2 via NASAL
  Filled 2015-04-29: qty 16

## 2015-04-29 MED ORDER — BENZONATATE 100 MG PO CAPS: 100.0000 mg | ORAL_CAPSULE | Freq: Two times a day (BID) | ORAL | Status: DC | PRN

## 2015-04-29 MED ORDER — ACETAMINOPHEN 325 MG PO TABS
650.0000 mg | ORAL_TABLET | Freq: Four times a day (QID) | ORAL | Status: DC | PRN
Start: 2015-04-29 — End: 2015-05-06
  Administered 2015-04-29 – 2015-05-05 (×2): 650 mg via ORAL
  Filled 2015-04-29 (×2): qty 2

## 2015-04-29 MED ORDER — ALUM & MAG HYDROXIDE-SIMETH 200-200-20 MG/5ML PO SUSP
30.0000 mL | ORAL | Status: DC | PRN
  Administered 2015-05-02: 30 mL via ORAL
  Filled 2015-04-29: qty 30

## 2015-04-29 MED ORDER — IBUPROFEN 800 MG PO TABS
800.0000 mg | ORAL_TABLET | Freq: Once | ORAL | Status: AC
  Administered 2015-04-29: 800 mg via ORAL
  Filled 2015-04-29 (×2): qty 1

## 2015-04-29 MED ORDER — AMOXICILLIN 250 MG/5ML PO SUSR
500.0000 mg | Freq: Three times a day (TID) | ORAL | Status: AC
  Administered 2015-04-29 – 2015-05-04 (×15): 500 mg via ORAL
  Filled 2015-04-29 (×21): qty 10

## 2015-04-29 MED ORDER — NICOTINE 21 MG/24HR TD PT24
21.0000 mg | MEDICATED_PATCH | Freq: Every day | TRANSDERMAL | Status: DC
  Administered 2015-04-30 – 2015-05-05 (×7): 21 mg via TRANSDERMAL
  Filled 2015-04-29 (×9): qty 1

## 2015-04-29 MED ORDER — FLUTICASONE PROPIONATE 50 MCG/ACT NA SUSP
1.0000 | Freq: Every day | NASAL | Status: DC
  Administered 2015-04-29: 1 via NASAL
  Filled 2015-04-29 (×2): qty 16

## 2015-04-29 MED ORDER — HYDROXYZINE HCL 25 MG PO TABS
25.0000 mg | ORAL_TABLET | Freq: Three times a day (TID) | ORAL | Status: DC | PRN
  Administered 2015-04-29 – 2015-05-01 (×3): 25 mg via ORAL
  Filled 2015-04-29: qty 1
  Filled 2015-04-29: qty 10
  Filled 2015-04-29 (×2): qty 1

## 2015-04-29 MED ORDER — FLUOXETINE HCL 20 MG PO CAPS
20.0000 mg | ORAL_CAPSULE | Freq: Every day | ORAL | Status: DC
  Administered 2015-04-29 – 2015-05-02 (×4): 20 mg via ORAL
  Filled 2015-04-29 (×6): qty 1

## 2015-04-29 MED ORDER — MAGNESIUM HYDROXIDE 400 MG/5ML PO SUSP
30.0000 mL | Freq: Every day | ORAL | Status: DC | PRN
  Administered 2015-05-01 – 2015-05-04 (×3): 30 mL via ORAL
  Filled 2015-04-29 (×3): qty 30

## 2015-04-29 NOTE — Tx Team (Signed)
Interdisciplinary Treatment Plan Update (Adult) Date: 04/29/2015   Date: 04/29/2015 1:29 PM  Progress in Treatment:  Attending groups: Yes  Participating in groups: Yes, minimally  Taking medication as prescribed: Yes  Tolerating medication: Yes  Family/Significant othe contact made: No, CSW assessing for appropriate contact Patient understands diagnosis: Continuing to assess Discussing patient identified problems/goals with staff: Yes  Medical problems stabilized or resolved: Yes  Denies suicidal/homicidal ideation: Yes Patient has not harmed self or Others: Yes   New problem(s) identified: None identified at this time.   Discharge Plan or Barriers: Pt will return home and follow-up with outpatient provider  Additional comments:  Patient and CSW reviewed pt's identified goals and treatment plan. Patient verbalized understanding and agreed to treatment plan. CSW reviewed [facility] "Discharge Process and Patient Involvement" Form. Pt verbalized understanding of information provided and signed form.   Reason for Continuation of Hospitalization:  Anxiety Depression Medical Issues Medication stabilization Suicidal ideation Withdrawal symptoms Psychotic  Estimated length of stay: 3-5 days  Review of initial/current patient goals per problem list:   1.  Goal(s): Patient will participate in aftercare plan  Met:  Yes  Target date: 3-5 days from date of admission   As evidenced by: Patient will participate within aftercare plan AEB aftercare provider and housing plan at discharge being identified.   04/29/15: Pt will return home and follow-up with outpatient provider  2.  Goal (s): Patient will exhibit decreased depressive symptoms and suicidal ideations.  Met:  Yes  Target date: 3-5 days from date of admission   As evidenced by: Patient will utilize self rating of depression at 3 or below and demonstrate decreased signs of depression or be deemed stable for discharge by  MD.  04/29/15: Pt rates depression 0/10; denies SI  Attendees:  Patient:    Family:    Physician: Dr. Parke Poisson, MD  04/29/2015 1:29 PM  Nursing: Lars Pinks, RN Case manager  04/29/2015 1:29 PM  Clinical Social Worker Peri Maris, [place] 04/29/2015 1:29 PM  Other: Tilden Fossa, [place] 04/29/2015 1:29 PM  Clinical:  Sandre Kitty, RN 04/29/2015 1:29 PM  Other: , RN Charge Nurse 04/29/2015 1:29 PM  Other: Hilda Lias, [place], [place] Work [phone]

## 2015-04-29 NOTE — Tx Team (Signed)
Initial Interdisciplinary Treatment Plan   PATIENT STRESSORS: Health problems Medication change or noncompliance   PATIENT STRENGTHS: Ability for insight Capable of independent living Motivation for treatment/growth   PROBLEM LIST: Problem List/Patient Goals Date to be addressed Date deferred Reason deferred Estimated date of resolution  "I want to work on my sinus problems" 04/29/2015     "I want to feel good, to be able to work and pay my bills and be in a good mood" 04/29/2015     Depression 04/29/2015     Suicidal ideation 04/29/2015                                    DISCHARGE CRITERIA:  Ability to meet basic life and health needs Improved stabilization in mood, thinking, and/or behavior Verbal commitment to aftercare and medication compliance  PRELIMINARY DISCHARGE PLAN: Outpatient therapy Return to previous work or school arrangements  PATIENT/FAMIILY INVOLVEMENT: This treatment plan has been presented to and reviewed with the patient, [employer].  The patient and family have been given the opportunity to ask questions and make suggestions.  Claiborne Rigg 04/29/2015, 2:41 AM

## 2015-04-29 NOTE — Consult Note (Signed)
[facility] Medical Consultation  Jessica Shea QQP:[id number] DOB: 25-Aug-1970 DOA: 04/29/2015   PCP: Arnoldo Morale, MD    Requesting physician: Dr. Parke Poisson Date of consultation: 04/29/15 Reason for consultation: Anemia and sinus congestion  Chief Complaint: Stuffy nose  HPI: Jessica Shea is a 45 y.o. female  with a past medical history primarily of high depression and anxiety who was admitted to behavioral health for severe depression. [facility] for sinus headaches and congestion. She was prescribed a course of amoxicillin and prednisone and given prescriptions for Flonase as well. She continues to have the symptoms of sinus congestion, facial tenderness and pain, stuffy nose as a result of which she has difficulty with her breathing. She also complains of discomfort in her upper abdomen. She feels as if she has a hernia. She also has complains of for pain and swelling in the right leg. Most of the symptoms are long-standing, ongoing for months, except for the pain in the right leg which started just about 4-5 weeks ago after a fall. She was seen in the emergency department and underwent Doppler studies which revealed a hematoma in the right calf. No DVT was noted. Evaluation in the emergency department and behavioral health also revealed microcytic anemia. She does tell me that she has heavy menstrual periods. She also was noted to be hypokalemic. Hospitalist was consulted to help with managing these issues   Home Medications: Prior to Admission medications   Medication Sig Start Date End Date Taking? Authorizing Provider  albuterol (PROVENTIL HFA;VENTOLIN HFA) 108 (90 BASE) MCG/ACT inhaler Inhale 1-2 puffs into the lungs every 6 (six) hours as needed for wheezing. 01/11/11 04/28/15  Rolland Porter, MD  amoxicillin (AMOXIL) 500 MG capsule Take 1 capsule (500 mg total) by mouth 3 (three) times daily. 04/22/15   Arnoldo Morale, MD    amoxicillin-clavulanate (AUGMENTIN) 875-125 MG tablet Take 1 tablet by mouth 2 (two) times daily. Patient not taking: Reported on 04/28/2015 04/01/15   Brayton Caves, PA-C  benzonatate (TESSALON) 100 MG capsule Take 1 capsule (100 mg total) by mouth 2 (two) times daily as needed for cough. Patient not taking: Reported on 04/28/2015 04/01/15   Brayton Caves, PA-C  FLUoxetine (PROZAC) 20 MG tablet Take 1 tablet (20 mg total) by mouth daily. 04/22/15   Arnoldo Morale, MD  fluticasone (FLONASE) 50 MCG/ACT nasal spray Place 1 spray into both nostrils daily. Patient taking differently: Place 1 spray into both nostrils daily as needed for allergies.  04/22/15   Arnoldo Morale, MD  HYDROcodone-homatropine (HYCODAN) 5-1.5 MG/5ML syrup Take 5 mLs by mouth every 8 (eight) hours as needed for cough. Patient not taking: Reported on 04/28/2015 04/01/15   Brayton Caves, PA-C  LORazepam (ATIVAN) 1 MG tablet Take 1 tablet (1 mg total) by mouth every 8 (eight) hours. Reported on 04/01/2015 04/01/15   Brayton Caves, PA-C  naproxen sodium (ANAPROX) 220 MG tablet Take 660 mg by mouth daily as needed (pain).     Historical Provider, MD  predniSONE (DELTASONE) 20 MG tablet Take 1 tablet (20 mg total) by mouth daily with breakfast. Patient not taking: Reported on 04/28/2015 04/22/15   Arnoldo Morale, MD  sucralfate (CARAFATE) 1 GM/10ML suspension Take 10 mLs (1 g total) by mouth 4 (four) times daily -  with meals and at bedtime. Patient not taking: Reported on 04/28/2015 04/24/15   April Palumbo, MD    Current Inpatient Medications:  Scheduled: .  amoxicillin  500 mg Oral 3 times per day  . FLUoxetine  20 mg Oral Daily  . [START ON 04/30/2015] fluticasone  2 spray Each Nare Daily  . [START ON 04/30/2015] nicotine  21 mg Transdermal Daily  . potassium chloride  20 mEq Oral BID   Continuous:  GUR:[id number], alum & mag hydroxide-simeth, benzonatate, hydrOXYzine, magnesium hydroxide  Allergies: No Known Allergies  Past Medical  History: Past Medical History  Diagnosis Date  . Thyroid disease   . Left thyroid nodule 2012  . Sinus bradycardia 2012  . Anemia   . Dyspnea 2012  . Sinusitis, acute ethmoidal     History reviewed. No pertinent past surgical history.  Social History:  Social History   Social History  . Marital Status: Single    Spouse Name: N/A  . Number of Children: N/A  . Years of Education: N/A   Occupational History  . Not on file.   Social History Main Topics  . Smoking status: Current Every Day Smoker -- 0.25 packs/day    Types: Cigarettes  . Smokeless tobacco: Not on file  . Alcohol Use: No  . Drug Use: No  . Sexual Activity: Yes    Birth Control/ Protection: None   Other Topics Concern  . Not on file   Social History Narrative    Family History: history of depression and high blood pressure in the family  Review of Systems - History obtained from the patient General ROS: positive for  - malaise Psychological ROS: positive for - depression Ophthalmic ROS: negative ENT ROS: as in hpi Allergy and Immunology ROS: negative Hematological and Lymphatic ROS: negative Endocrine ROS: negative Respiratory ROS: no cough, shortness of breath, or wheezing Cardiovascular ROS: no chest pain or dyspnea on exertion Gastrointestinal ROS: as in hpi Genito-Urinary ROS: no dysuria, trouble voiding, or hematuria Musculoskeletal ROS: as in hpi Neurological ROS: no TIA or stroke symptoms Dermatological ROS: negative  Physical Examination: Filed Vitals:   04/29/15 0030 04/29/15 0035  BP: 104/57 99/68  Pulse: 63 79  Temp: 98.2 F (36.8 C)   TempSrc: Oral   Resp:  18  Height: _0  (1.702 m)   Weight: 78.926 kg (174 lb)     General appearance: alert, cooperative, appears stated age and flat affect Head: Normocephalic, without obvious abnormality, atraumatic, Slightly tender over frontal and maxillary sinuses Eyes: conjunctivae/corneas clear. PERRL, EOM's intact.  Throat: lips,  mucosa, and tongue normal; teeth and gums normal Neck: no adenopathy, no carotid bruit, no JVD, supple, symmetrical, trachea midline and thyroid not enlarged, symmetric, no tenderness/mass/nodules Resp: clear to auscultation bilaterally Cardio: regular rate and rhythm, S1, S2 normal, no murmur, click, rub or gallop GI: Abdomen is soft. Slightly tender over the epigastric area without any rebound, rigidity or guarding. No masses or organomegaly. Extremities: swelling noted in the right calf Pulses: 2+ and symmetric Skin: Skin color, texture, turgor normal. No rashes or lesions Lymph nodes: Cervical, supraclavicular, and axillary nodes normal. Neurologic: Awake, alert. Flat affect. No focal neurological deficits.  Laboratory Data: Results for orders placed or performed during the hospital encounter of 04/28/15 (from the past 48 hour(s))  Ethanol (ETOH)     Status: None   Collection Time: 04/28/15  1:17 PM  Result Value Ref Range   Alcohol, Ethyl (B) <5 <5 mg/dL    Comment:        LOWEST DETECTABLE LIMIT FOR SERUM ALCOHOL IS 5 mg/dL FOR MEDICAL PURPOSES ONLY   Salicylate level  Status: None   Collection Time: 04/28/15  1:17 PM  Result Value Ref Range   Salicylate Lvl <7.5 2.8 - 30.0 mg/dL  Acetaminophen level     Status: Abnormal   Collection Time: 04/28/15  1:17 PM  Result Value Ref Range   Acetaminophen (Tylenol), Serum <10 (L) 10 - 30 ug/mL    Comment:        THERAPEUTIC CONCENTRATIONS VARY SIGNIFICANTLY. A RANGE OF 10-30 ug/mL MAY BE AN EFFECTIVE CONCENTRATION FOR MANY PATIENTS. HOWEVER, SOME ARE BEST TREATED AT CONCENTRATIONS OUTSIDE THIS RANGE. ACETAMINOPHEN CONCENTRATIONS >150 ug/mL AT 4 HOURS AFTER INGESTION AND >50 ug/mL AT 12 HOURS AFTER INGESTION ARE OFTEN ASSOCIATED WITH TOXIC REACTIONS.   I-Stat beta hCG blood, ED ([facility], [facility], [facility] only)     Status: None   Collection Time: 04/28/15  1:20 PM  Result Value Ref Range   I-stat hCG, quantitative <5.0 <5 mIU/mL    Comment 3            Comment:   GEST. AGE      CONC.  (mIU/mL)   <=1 WEEK        5 - 50     2 WEEKS       50 - 500     3 WEEKS       100 - 10,000     4 WEEKS     1,000 - 30,000        FEMALE AND NON-PREGNANT FEMALE:     LESS THAN 5 mIU/mL   Comprehensive metabolic panel     Status: Abnormal   Collection Time: 04/28/15  1:47 PM  Result Value Ref Range   Sodium 137 135 - 145 mmol/L   Potassium 3.1 (L) 3.5 - 5.1 mmol/L   Chloride 104 101 - 111 mmol/L   CO2 25 22 - 32 mmol/L   Glucose, Bld 101 (H) 65 - 99 mg/dL   BUN 10 6 - 20 mg/dL   Creatinine, Ser 0.70 0.44 - 1.00 mg/dL   Calcium 8.6 (L) 8.9 - 10.3 mg/dL   Total Protein 7.3 6.5 - 8.1 g/dL   Albumin 4.1 3.5 - 5.0 g/dL   AST 24 15 - 41 U/L   ALT 22 14 - 54 U/L   Alkaline Phosphatase 48 38 - 126 U/L   Total Bilirubin 0.5 0.3 - 1.2 mg/dL   GFR calc non Af Amer >60 >60 mL/min   GFR calc Af Amer >60 >60 mL/min    Comment: (NOTE) The eGFR has been calculated using the CKD EPI equation. This calculation has not been validated in all clinical situations. eGFR's persistently <60 mL/min signify possible Chronic Kidney Disease.    Anion gap 8 5 - 15  CBC     Status: Abnormal   Collection Time: 04/28/15  1:47 PM  Result Value Ref Range   WBC 5.8 4.0 - 10.5 K/uL   RBC 3.97 3.87 - 5.11 MIL/uL   Hemoglobin 8.0 (L) 12.0 - 15.0 g/dL   HCT 28.1 (L) 36.0 - 46.0 %   MCV 70.8 (L) 78.0 - 100.0 fL   MCH 20.2 (L) 26.0 - 34.0 pg   MCHC 28.5 (L) 30.0 - 36.0 g/dL   RDW 18.2 (H) 11.5 - 15.5 %   Platelets 417 (H) 150 - 400 K/uL  Urine rapid drug screen (hosp performed) (Not at [facility])     Status: None   Collection Time: 04/28/15  2:32 PM  Result Value Ref Range   Opiates NONE  DETECTED NONE DETECTED   Cocaine NONE DETECTED NONE DETECTED   Benzodiazepines NONE DETECTED NONE DETECTED   Amphetamines NONE DETECTED NONE DETECTED   Tetrahydrocannabinol NONE DETECTED NONE DETECTED   Barbiturates NONE DETECTED NONE DETECTED    Comment:        DRUG  SCREEN FOR MEDICAL PURPOSES ONLY.  IF CONFIRMATION IS NEEDED FOR ANY PURPOSE, NOTIFY LAB WITHIN 5 DAYS.        LOWEST DETECTABLE LIMITS FOR URINE DRUG SCREEN Drug Class       Cutoff (ng/mL) Amphetamine      1000 Barbiturate      200 Benzodiazepine   675 Tricyclics       916 Opiates          300 Cocaine          300 THC              50     Imaging Studies: No results found.  EKG: Sinus bradycardia with heart rate in the 50s. No concerning ST or T-wave changes.  Impression/Recommendations  Principal Problem:   MDD (major depressive disorder), recurrent episode, severe (HCC) Active Problems:   Depression with anxiety   This is a 45 year old African American female with asthmatic history primarily of depression and anxiety who is admitted to behavioral health for severe depression. Has multiple complaints. There is an element of psychosomatic component. She is primarily concerned about her sinus congestion but this is been ongoing for many months. She is also concerned about the possibility of a hernia in her abdomen, which has also been an issue for many months. And she is concerned about this swelling in her right leg. She is also noted to have microcytic anemia.  #1 Chronic recurrent sinusitis: She is afebrile. WBC was normal. Unlikely to be acute bacterial infection. She is noted to be on amoxicillin, which was started by her outpatient providers. Okay to continue for 5 more days. Agree with Flonase but we will increase to 2 sprays daily. I have told her that she may need to be seen by an ENT doctor as an outpatient.  #2 Microcytic anemia: She does report heavy menstrual bleed. Her last menstrual period was a month ago. She thinks that she has uterine fibroids. She also has his hematoma in the right calf. All of this could be contributing to the anemia. We will repeat her hemoglobin tomorrow morning and also check an anemia panel. She will benefit from being on iron supplementation  if she is iron deficient.  #3 Right calf swelling: Doppler studies done on February 7 were reviewed. No DVT was noted. She had sustained a fall about 6 days prior to that Doppler study. So this is most likely a hematoma. Does not warrant any immediate intervention. This could've also accounted to some degree to her anemia.  #4 Hypokalemia: Agree with repletion. Repeat labs tomorrow morning along with a magnesium level.  #5 Concern about abdominal Hernia: This is a chronic issue. She has not had any nausea, vomiting. Abdominal examination is benign. She has been encouraged to discuss this with her primary care provider.  [facility] will followup again tomorrow. Please contact me if I can be of assistance in the meanwhile. Thank you for this consultation.  [facility] Pager [phone]  If 7PM-7AM, please contact night-coverage.  [email or web address] Password [facility]  04/29/2015, 2:38 PM

## 2015-04-29 NOTE — ED Notes (Signed)
Pt transported to [facility] by [employer] for continuation of specialized care. Pt left in no acute distress. Belongings signed for and given to [employer] officer. Pt left in no acute distress. 

## 2015-04-29 NOTE — [facility] Group Notes (Signed)
[facility] LCSW Group Therapy 04/29/2015 1:15pm  Type of Therapy: Group Therapy- Feelings Around Relapse and Recovery  Pt arrived at the end of group discussion; did not participate verbally.  Chad Cordial, Theresia Majors [phone] 04/29/2015 3:17 PM

## 2015-04-29 NOTE — Progress Notes (Signed)
Recreation Therapy Notes  Date: 02.24.2017 Time: 9:30am Location: [address] Group Room  Group Topic: Stress Management  Goal Area(s) Addresses:  Patient will actively participate in stress management techniques presented during session.   Behavioral Response: Did not attend.   Marykay Lex Janya Eveland, LRT/CTRS  Shevelle Smither L 04/29/2015 10:15 AM

## 2015-04-29 NOTE — Progress Notes (Addendum)
Jessica Shea is flat, blunted and appears to feel bad. She  Has spent much of her day laying in the bed. She says her face hurts, that she feels " crampy" because she feels she is getting ready to start her period, she feels " bad all over" and has decreased appetite, is tired and her right calf hurts ( from the hematoma).  She completed her daily assessment and on it she wrote she denied Si today and she rated her depression, hopelessness and anxiety " 10/10? 9", respectively.   A She saw the internist today for her multiple medical issues and is started on bid potassium ( k 3.1 on the 23rd) , says her amoxicillin ( for acute sinusitis) needs to be continued for 5 more days,  And says she has microcytic anemia ( complicated by  Right calf hematoma and pt-reported uterine fibroids). R Med compliance encouraged and poc cont.

## 2015-04-29 NOTE — Progress Notes (Signed)
Admission Note: Jessica Shea is very sleepy during assessment. She falls asleep during questions and this writer has to repeat questions multiple times. She is calm and appropriate to situation. Very tearful and states she wants help with her mood and to feel good again. She endorses being noncompliant with prozac in the past as well as a another antidepressant that she is unable to name. She lives alone and reports she has no real support system but states her sister can be used as her emergency contact. 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 She is endorsing frontal and maxillary pain and states she has been trying to get rid of a sinus infection for several weeks now. Has tried 2 different antibiotics and she states she does not feel any better. Endorsing nasal congestion as well. She is hoping this will clear and states this may be what is contributing to her depression. Rates Sinus pain 11/10 Anxiety and Depression 10/10. Endorses frequent feelings of pending panic attacks: heart racing, jitteriness and nervousness. Denies SI/HI/AVH at this time. She states she does not drink alcohol and only smokes about 5 cigarettes a day. Encouragement and support given. Adelee has been oriented to the unit and is currently resting in her bed. She is slightly unsteady on her feet. Unclear if due to sinusitis or anxiolytics. Will continue to monitor Q 15 minutes for patient safety.

## 2015-04-29 NOTE — [facility] Group Notes (Signed)
[facility] LCSW Aftercare Discharge Planning Group Note  04/29/2015 8:45 AM  Participation Quality: Alert, Appropriate and Oriented  Mood/Affect: Flat  Depression Rating: 0  Anxiety Rating: 0  Thoughts of Suicide: Pt denies SI/HI  Will you contract for safety? Yes  Current AVH: Pt denies  Plan for Discharge/Comments: Pt attended discharge planning group and actively participated in group. CSW discussed suicide prevention education with the group and encouraged them to discuss discharge planning and any relevant barriers. Pt forwarded limited information; she expressed feeling "so-so" today.  Transportation Means: Pt reports access to transportation  Supports: No supports mentioned at this time  Chad Cordial, LCSWA 04/29/2015 1:10 PM

## 2015-04-29 NOTE — [facility] Suicide Risk Assessment (Signed)
[facility] Admission Suicide Risk Assessment   Nursing information obtained from:  Patient Demographic factors:  Living alone Current Mental Status:  NA Loss Factors:  NA Historical Factors:  Prior suicide attempts Risk Reduction Factors:  Employed, Positive coping skills or problem solving skills  Total Time spent with patient: 45 minutes Principal Problem: MDD (major depressive disorder), recurrent episode, severe (HCC) Diagnosis:   Patient Active Problem List   Diagnosis Date Noted  . MDD (major depressive disorder), recurrent episode, severe (HCC) [F33.2] 04/29/2015  . Depression with anxiety [F41.8] 04/22/2015     Continued Clinical Symptoms:  Alcohol Use Disorder Identification Test Final Score (AUDIT): 0 The "Alcohol Use Disorders Identification Test", Guidelines for Use in Primary Care, Second Edition.  World Science writer [facility] [employer]). Score between 0-7:  no or low risk or alcohol related problems. Score between 8-15:  moderate risk of alcohol related problems. Score between 16-19:  high risk of alcohol related problems. Score 20 or above:  warrants further diagnostic evaluation for alcohol dependence and treatment.   CLINICAL FACTORS:  45 year old female, lives alone, employed, reports worsening anxiety, frequent panic attacks, and worsening depression. Attributes this in part to chronic sinusitis symptoms which have caused her to feel congested and unable to breathe at times. Currently breathing comfortably at room air .  Reports history of good response to Prozac      Psychiatric Specialty Exam: ROS  Blood pressure 99/68, pulse 79, temperature 98.2 F (36.8 C), temperature source Oral, resp. rate 18, height  (1.702 m), weight 174 lb (78.926 kg), last menstrual period 04/02/2015.Body mass index is 27.25 kg/(m^2).   see admit note MSE                                                       COGNITIVE FEATURES THAT CONTRIBUTE TO RISK:   Closed-mindedness and Loss of executive function    SUICIDE RISK:   Moderate:  Frequent suicidal ideation with limited intensity, and duration, some specificity in terms of plans, no associated intent, good self-control, limited dysphoria/symptomatology, some risk factors present, and identifiable protective factors, including available and accessible social support.  PLAN OF CARE: Patient will be admitted to inpatient psychiatric unit for stabilization and safety. Will provide and encourage milieu participation. Provide medication management and maked adjustments as needed.  Will follow daily.    I certify that inpatient services furnished can reasonably be expected to improve the patient's condition.   Nehemiah Massed, MD 04/29/2015, 2:37 PM

## 2015-04-29 NOTE — [facility] Counselor (Signed)
CSW attempted to meet with Pt to complete PSA but she would not respond as she was sleeping.   Chad Cordial, LCSWA Clinical Social Work [phone]

## 2015-04-29 NOTE — H&P (Signed)
Psychiatric Admission Assessment Adult  Patient Identification: Jessica Shea MRN:  [id number] Date of Evaluation:  04/29/2015 Chief Complaint:   " I am having Panic Attacks" Principal Diagnosis: MDD (major depressive disorder), recurrent episode, severe (HCC)/ Panic Disorder without Agoraphobia  Diagnosis:   Patient Active Problem List   Diagnosis Date Noted  . MDD (major depressive disorder), recurrent episode, severe ([facility]) [F33.2] 04/29/2015  . Depression with anxiety [F41.8] 04/22/2015   History of Present Illness::   45 year old female. States she has been depressed and also reports significant anxiety symptoms - reports she has had frequent  panic attacks, particularly over the last 2 months . She reports that she has had symptoms of  upper respiratory infection, and recently was diagnosed with sinusitis. She still reports nasal congestion/ postanasal drip, which she feels is contributing to her anxiety, as she has subjective sense she may be  unable to breathe . She feels this is contributing to her anxiety and to insomnia .  In addition to anxiety , she reports depression , which she feels is at least partly related to her anxiety.  She reports significant neuro-vegetative symptoms and passive suicidal ideations, but denies plan or intention of hurting self  Of note, she has been going to outpatient medical clinics or EDs on several occasions over recent weeks, due to anxiety.    Associated Signs/Symptoms: Depression Symptoms:  depressed mood, anhedonia, insomnia, difficulty concentrating, recurrent thoughts of death, suicidal thoughts without plan, loss of energy/fatigue, decreased appetite, low sense of self esteem (Hypo) Manic Symptoms: denies  Anxiety Symptoms:  States she struggles with anxiety, with frequent panic attacks  Psychotic Symptoms: denies  PTSD Symptoms: Denies  Total Time spent with patient: 45 minutes  Past Psychiatric History:  No prior psychiatric  history, one suicide attempt when she was 45 year old by overdosing.   Denies history of self cutting, Denies any history of psychosis, denies history of mania or hypomania, reports history of anxiety, worsening recently, which she recently  attributes , as above, to sinusitis. At this time she is not taking any psychiatric medications- she had been prescribed lorazepam and prozac but took it only for 2-3 weeks .    Is the patient at risk to self? Yes.    Has the patient been a risk to self in the past 6 months? No.  Has the patient been a risk to self within the distant past? No.  Is the patient a risk to others? No.  Has the patient been a risk to others in the past 6 months? No.  Has the patient been a risk to others within the distant past? No.   Prior Inpatient Therapy:  denies  Prior Outpatient Therapy: no outpatient psychiatric treatment   Alcohol Screening: 1. How often do you have a drink containing alcohol?: Never 2. How many drinks containing alcohol do you have on a typical day when you are drinking?: 1 or 2 3. How often do you have six or more drinks on one occasion?: Never Preliminary Score: 0 4. How often during the last year have you found that you were not able to stop drinking once you had started?: Never 5. How often during the last year have you failed to do what was normally expected from you becasue of drinking?: Never 6. How often during the last year have you needed a first drink in the morning to get yourself going after a heavy drinking session?: Never 7. How often during the last  year have you had a feeling of guilt of remorse after drinking?: Never 8. How often during the last year have you been unable to remember what happened the night before because you had been drinking?: Never 9. Have you or someone else been injured as a result of your drinking?: No 10. Has a relative or friend or a doctor or another health worker been concerned about your drinking or  suggested you cut down?: No Alcohol Use Disorder Identification Test Final Score (AUDIT): 0 Brief Intervention: AUDIT score less than 7 or less-screening does not suggest unhealthy drinking-brief intervention not indicated Substance Abuse History in the last 12 months:   Denies any drug or alcohol abuse  Consequences of Substance Abuse: Denies  Previous Psychotropic Medications: States she has been on Prozac and she feels this medication has helped and has been well tolerated  Psychological Evaluations:  No  Past Medical History:  Past Medical History  Diagnosis Date  . Thyroid disease   . Left thyroid nodule 2012  . Sinus bradycardia 2012  . Anemia   . Dyspnea 2012  . Sinusitis, acute ethmoidal    History reviewed. No pertinent past surgical history. Family History:  Mother alive, lives in [place], father died years ago, unknown reasons, two siblings  Family Psychiatric  History: denies any mental illness history in family, denies any history of suicides in the family  Tobacco Screening:  Smokes only occasionally  Social History: Divorced, lives alone, no children, she works in a [employer], denies legal issues except for some tax/ IRS  issues History  Alcohol Use No     History  Drug Use No    Additional Social History:      History of alcohol / drug use?: No history of alcohol / drug abuse  Allergies:  No Known Allergies Lab Results:  Results for orders placed or performed during the hospital encounter of 04/28/15 (from the past 69 hour(s))  Ethanol (ETOH)     Status: None   Collection Time: 04/28/15  1:17 PM  Result Value Ref Range   Alcohol, Ethyl (B) <5 <5 mg/dL    Comment:        LOWEST DETECTABLE LIMIT FOR SERUM ALCOHOL IS 5 mg/dL FOR MEDICAL PURPOSES ONLY   Salicylate level     Status: None   Collection Time: 04/28/15  1:17 PM  Result Value Ref Range   Salicylate Lvl <2.7 2.8 - 30.0 mg/dL  Acetaminophen level     Status: Abnormal   Collection Time: 04/28/15   1:17 PM  Result Value Ref Range   Acetaminophen (Tylenol), Serum <10 (L) 10 - 30 ug/mL    Comment:        THERAPEUTIC CONCENTRATIONS VARY SIGNIFICANTLY. A RANGE OF 10-30 ug/mL MAY BE AN EFFECTIVE CONCENTRATION FOR MANY PATIENTS. HOWEVER, SOME ARE BEST TREATED AT CONCENTRATIONS OUTSIDE THIS RANGE. ACETAMINOPHEN CONCENTRATIONS >150 ug/mL AT 4 HOURS AFTER INGESTION AND >50 ug/mL AT 12 HOURS AFTER INGESTION ARE OFTEN ASSOCIATED WITH TOXIC REACTIONS.   I-Stat beta hCG blood, ED ([facility], [facility], [facility] only)     Status: None   Collection Time: 04/28/15  1:20 PM  Result Value Ref Range   I-stat hCG, quantitative <5.0 <5 mIU/mL   Comment 3            Comment:   GEST. AGE      CONC.  (mIU/mL)   <=1 WEEK        5 - 50     2 WEEKS  50 - 500     3 WEEKS       100 - 10,000     4 WEEKS     1,000 - 30,000        FEMALE AND NON-PREGNANT FEMALE:     LESS THAN 5 mIU/mL   Comprehensive metabolic panel     Status: Abnormal   Collection Time: 04/28/15  1:47 PM  Result Value Ref Range   Sodium 137 135 - 145 mmol/L   Potassium 3.1 (L) 3.5 - 5.1 mmol/L   Chloride 104 101 - 111 mmol/L   CO2 25 22 - 32 mmol/L   Glucose, Bld 101 (H) 65 - 99 mg/dL   BUN 10 6 - 20 mg/dL   Creatinine, Ser 0.70 0.44 - 1.00 mg/dL   Calcium 8.6 (L) 8.9 - 10.3 mg/dL   Total Protein 7.3 6.5 - 8.1 g/dL   Albumin 4.1 3.5 - 5.0 g/dL   AST 24 15 - 41 U/L   ALT 22 14 - 54 U/L   Alkaline Phosphatase 48 38 - 126 U/L   Total Bilirubin 0.5 0.3 - 1.2 mg/dL   GFR calc non Af Amer >60 >60 mL/min   GFR calc Af Amer >60 >60 mL/min    Comment: (NOTE) The eGFR has been calculated using the CKD EPI equation. This calculation has not been validated in all clinical situations. eGFR's persistently <60 mL/min signify possible Chronic Kidney Disease.    Anion gap 8 5 - 15  CBC     Status: Abnormal   Collection Time: 04/28/15  1:47 PM  Result Value Ref Range   WBC 5.8 4.0 - 10.5 K/uL   RBC 3.97 3.87 - 5.11 MIL/uL   Hemoglobin 8.0 (L)  12.0 - 15.0 g/dL   HCT 28.1 (L) 36.0 - 46.0 %   MCV 70.8 (L) 78.0 - 100.0 fL   MCH 20.2 (L) 26.0 - 34.0 pg   MCHC 28.5 (L) 30.0 - 36.0 g/dL   RDW 18.2 (H) 11.5 - 15.5 %   Platelets 417 (H) 150 - 400 K/uL  Urine rapid drug screen (hosp performed) (Not at [facility])     Status: None   Collection Time: 04/28/15  2:32 PM  Result Value Ref Range   Opiates NONE DETECTED NONE DETECTED   Cocaine NONE DETECTED NONE DETECTED   Benzodiazepines NONE DETECTED NONE DETECTED   Amphetamines NONE DETECTED NONE DETECTED   Tetrahydrocannabinol NONE DETECTED NONE DETECTED   Barbiturates NONE DETECTED NONE DETECTED    Comment:        DRUG SCREEN FOR MEDICAL PURPOSES ONLY.  IF CONFIRMATION IS NEEDED FOR ANY PURPOSE, NOTIFY LAB WITHIN 5 DAYS.        LOWEST DETECTABLE LIMITS FOR URINE DRUG SCREEN Drug Class       Cutoff (ng/mL) Amphetamine      1000 Barbiturate      200 Benzodiazepine   179 Tricyclics       150 Opiates          300 Cocaine          300 THC              50     Blood Alcohol level:  Lab Results  Component Value Date   ETH <5 56/97/9480    Metabolic Disorder Labs:  Lab Results  Component Value Date   HGBA1C 5.20 04/22/2015   No results found for: PROLACTIN No results found for: CHOL, TRIG, HDL, CHOLHDL, VLDL, LDLCALC  Current Medications: Current Facility-Administered Medications  Medication Dose Route Frequency Provider Last Rate Last Dose  . acetaminophen (TYLENOL) tablet 650 mg  650 mg Oral Q6H PRN Harriet Butte, NP   650 mg at 04/29/15 0836  . alum & mag hydroxide-simeth (MAALOX/MYLANTA) 200-200-20 MG/5ML suspension 30 mL  30 mL Oral Q4H PRN Harriet Butte, NP      . amoxicillin (AMOXIL) capsule 500 mg  500 mg Oral TID Harriet Butte, NP   500 mg at 04/29/15 0835  . benzonatate (TESSALON) capsule 100 mg  100 mg Oral BID PRN Harriet Butte, NP      . FLUoxetine (PROZAC) capsule 20 mg  20 mg Oral Daily Harriet Butte, NP   20 mg at 04/29/15 0834  . fluticasone  (FLONASE) 50 MCG/ACT nasal spray 1 spray  1 spray Each Nare Daily Harriet Butte, NP   1 spray at 04/29/15 8546  . hydrOXYzine (ATARAX/VISTARIL) tablet 25 mg  25 mg Oral TID PRN Harriet Butte, NP      . magnesium hydroxide (MILK OF MAGNESIA) suspension 30 mL  30 mL Oral Daily PRN Harriet Butte, NP      . Derrill Memo ON 04/30/2015] nicotine (NICODERM CQ - dosed in mg/24 hours) patch 21 mg  21 mg Transdermal Daily Harriet Butte, NP       PTA Medications: Prescriptions prior to admission  Medication Sig Dispense Refill Last Dose  . albuterol (PROVENTIL HFA;VENTOLIN HFA) 108 (90 BASE) MCG/ACT inhaler Inhale 1-2 puffs into the lungs every 6 (six) hours as needed for wheezing. 1 Inhaler 0 Past Week at Unknown time  . amoxicillin (AMOXIL) 500 MG capsule Take 1 capsule (500 mg total) by mouth 3 (three) times daily. 30 capsule 0 04/28/2015 at Unknown time  . amoxicillin-clavulanate (AUGMENTIN) 875-125 MG tablet Take 1 tablet by mouth 2 (two) times daily. (Patient not taking: Reported on 04/28/2015) 20 tablet 0 Completed Course at Unknown time  . benzonatate (TESSALON) 100 MG capsule Take 1 capsule (100 mg total) by mouth 2 (two) times daily as needed for cough. (Patient not taking: Reported on 04/28/2015) 20 capsule 0 Not Taking at Unknown time  . FLUoxetine (PROZAC) 20 MG tablet Take 1 tablet (20 mg total) by mouth daily. 30 tablet 1 04/28/2015 at Unknown time  . fluticasone (FLONASE) 50 MCG/ACT nasal spray Place 1 spray into both nostrils daily. (Patient taking differently: Place 1 spray into both nostrils daily as needed for allergies. ) 16 g 0 04/28/2015 at Unknown time  . HYDROcodone-homatropine (HYCODAN) 5-1.5 MG/5ML syrup Take 5 mLs by mouth every 8 (eight) hours as needed for cough. (Patient not taking: Reported on 04/28/2015) 120 mL 0 Not Taking at Unknown time  . LORazepam (ATIVAN) 1 MG tablet Take 1 tablet (1 mg total) by mouth every 8 (eight) hours. Reported on 04/01/2015 30 tablet 0 04/27/2015 at Unknown  time  . naproxen sodium (ANAPROX) 220 MG tablet Take 660 mg by mouth daily as needed (pain).    unknown  . predniSONE (DELTASONE) 20 MG tablet Take 1 tablet (20 mg total) by mouth daily with breakfast. (Patient not taking: Reported on 04/28/2015) 5 tablet 0 Completed Course at Unknown time  . sucralfate (CARAFATE) 1 GM/10ML suspension Take 10 mLs (1 g total) by mouth 4 (four) times daily -  with meals and at bedtime. (Patient not taking: Reported on 04/28/2015) 420 mL 0 Not Taking at Unknown time    Musculoskeletal: Strength & Muscle Tone:  within normal limits Gait & Station: normal Patient leans: N/A  Psychiatric Specialty Exam: Physical Exam  Review of Systems  Constitutional: Positive for malaise/fatigue.  HENT: Positive for congestion.   Eyes: Negative.   Respiratory: Positive for cough.   Cardiovascular: Negative.   Gastrointestinal: Negative.   Genitourinary: Negative.   Skin: Negative.   Neurological: Positive for headaches. Negative for seizures.  Psychiatric/Behavioral: Positive for depression.  All other systems reviewed and are negative.   Blood pressure 99/68, pulse 79, temperature 98.2 F (36.8 C), temperature source Oral, resp. rate 18, height '5\' 7"'  (1.702 m), weight 174 lb (78.926 kg), last menstrual period 04/02/2015.Body mass index is 27.25 kg/(m^2).  General Appearance: Fairly Groomed  Engineer, water::  Good  Speech:  Normal Rate  Volume:  Normal  Mood:  Depressed  Affect:  Constricted  Thought Process:  Linear  Orientation:  Other:  fully alert and attentive   Thought Content:  ruminative about health issues as above, denies hallucinations, does not appear internally preoccupied   Suicidal Thoughts:  describes some passive thoughts of death, dying, but denies any plan or intention of suicide or hurting self and contracts for safety on the unit  Homicidal Thoughts:  No  Memory:  recent and remote grossly intact   Judgement:  Fair  Insight:  Fair  Psychomotor  Activity:  Decreased  Concentration:  Good  Recall:  Good  Fund of Knowledge:Good  Language: Good  Akathisia:  Negative  Handed:  Right  AIMS (if indicated):     Assets:  Communication Skills Desire for Improvement Resilience  ADL's:  Intact  Cognition: WNL  Sleep:  Number of Hours: 4     Treatment Plan Summary: Daily contact with patient to assess and evaluate symptoms and progress in treatment, Medication management, Plan inpatient admission and medications as below   Observation Level/Precautions:  15 minute checks  Laboratory:  as needed   Psychotherapy:  Milieu, support   Medications:  Prozac 20 mgrs QDAY for depression  Consultations:  Have consulted hospitalist service for help with management of sinusitis, anemia   Discharge Concerns:  Limited support network  Estimated LOS: 5-6 days   Other:     I certify that inpatient services furnished can reasonably be expected to improve the patient's condition.    Neita Garnet, MD 2/24/201712:28 PM

## 2015-04-30 DIAGNOSIS — D509 Iron deficiency anemia, unspecified: Secondary | ICD-10-CM

## 2015-04-30 LAB — RETICULOCYTES
RBC.: 4.28 MIL/uL (ref 3.87–5.11)
RETIC COUNT ABSOLUTE: 64.2 10*3/uL (ref 19.0–186.0)
Retic Ct Pct: 1.5 % (ref 0.4–3.1)

## 2015-04-30 LAB — FOLATE: FOLATE: 21.5 ng/mL (ref >5.9)

## 2015-04-30 LAB — CBC
HEMATOCRIT: 30.2 % — AB (ref 36.0–46.0)
HEMOGLOBIN: 8.6 g/dL — AB (ref 12.0–15.0)
MCH: 20.1 pg — ABNORMAL LOW (ref 26.0–34.0)
MCHC: 28.5 g/dL — AB (ref 30.0–36.0)
MCV: 70.6 fL — ABNORMAL LOW (ref 78.0–100.0)
Platelets: 434 10*3/uL — ABNORMAL HIGH (ref 150–400)
RBC: 4.28 MIL/uL (ref 3.87–5.11)
RDW: 18.3 % — ABNORMAL HIGH (ref 11.5–15.5)
WBC: 9.8 10*3/uL (ref 4.0–10.5)

## 2015-04-30 LAB — BASIC METABOLIC PANEL
ANION GAP: 8 (ref 5–15)
BUN: 8 mg/dL (ref 6–20)
CHLORIDE: 108 mmol/L (ref 101–111)
CO2: 27 mmol/L (ref 22–32)
CREATININE: 0.7 mg/dL (ref 0.44–1.00)
Calcium: 9.3 mg/dL (ref 8.9–10.3)
GFR calc non Af Amer: >60 mL/min (ref >60)
Glucose, Bld: 93 mg/dL (ref 65–99)
POTASSIUM: 3.3 mmol/L — AB (ref 3.5–5.1)
Sodium: 143 mmol/L (ref 135–145)

## 2015-04-30 LAB — VITAMIN B12: Vitamin B-12: 314 pg/mL (ref 180–914)

## 2015-04-30 LAB — TSH: TSH: 2.721 u[IU]/mL (ref 0.350–4.500)

## 2015-04-30 LAB — IRON AND TIBC
IRON: 12 ug/dL — AB (ref 28–170)
SATURATION RATIOS: 3 % — AB (ref 10.4–31.8)
TIBC: 433 ug/dL (ref 250–450)
UIBC: 421 ug/dL

## 2015-04-30 LAB — MAGNESIUM: Magnesium: 2.2 mg/dL (ref 1.7–2.4)

## 2015-04-30 LAB — FERRITIN: Ferritin: 6 ng/mL — ABNORMAL LOW (ref 11–307)

## 2015-04-30 MED ORDER — VITAMIN C 500 MG PO TABS
500.0000 mg | ORAL_TABLET | Freq: Every day | ORAL | Status: DC
  Administered 2015-04-30 – 2015-05-05 (×6): 500 mg via ORAL
  Filled 2015-04-30 (×8): qty 1

## 2015-04-30 MED ORDER — FERROUS SULFATE 325 (65 FE) MG PO TABS
325.0000 mg | ORAL_TABLET | Freq: Two times a day (BID) | ORAL | Status: DC
  Administered 2015-04-30 – 2015-05-05 (×10): 325 mg via ORAL
  Filled 2015-04-30 (×14): qty 1

## 2015-04-30 MED ORDER — PNEUMOCOCCAL VAC POLYVALENT 25 MCG/0.5ML IJ INJ
0.5000 mL | INJECTION | INTRAMUSCULAR | Status: AC
  Administered 2015-05-02: 0.5 mL via INTRAMUSCULAR

## 2015-04-30 MED ORDER — INFLUENZA VAC SPLIT QUAD 0.5 ML IM SUSY
0.5000 mL | PREFILLED_SYRINGE | INTRAMUSCULAR | Status: AC
  Administered 2015-05-02: 0.5 mL via INTRAMUSCULAR
  Filled 2015-04-30: qty 0.5

## 2015-04-30 MED ORDER — VITAMIN B-12 100 MCG PO TABS
100.0000 ug | ORAL_TABLET | Freq: Every day | ORAL | Status: DC
  Administered 2015-04-30 – 2015-05-05 (×6): 100 ug via ORAL
  Filled 2015-04-30 (×8): qty 1

## 2015-04-30 MED ORDER — HYDROXYZINE HCL 50 MG PO TABS: 50.0000 mg | ORAL_TABLET | Freq: Every evening | ORAL | Status: DC | PRN

## 2015-04-30 MED ORDER — HYDROXYZINE HCL 50 MG PO TABS
50.0000 mg | ORAL_TABLET | Freq: Every day | ORAL | Status: DC
  Administered 2015-04-30 – 2015-05-04 (×5): 50 mg via ORAL
  Filled 2015-04-30 (×7): qty 1

## 2015-04-30 NOTE — Progress Notes (Signed)
Start iron supplementation once a day for IDA  Manson Passey [facility] [phone]

## 2015-04-30 NOTE — Progress Notes (Signed)
D Javon is up...OOB first thing this morning, directly after breakfast and she says ...." I feel a little better today!!!". She makes better eye contact. She speaks clearer and with a much stronger voice. She nods her head " yes" when this writer asks her " ae you feeling any better today?" . She smiles broadly and says " I slept so good last night".... A She completes her daily assessment and on it she wrote she deneid Si today, she rated her depression, hopelessnes and  anxeity " 8/9/9", respectively and she said " do you think Im getting better?? A She attneds her goups as planned, she take s her meds accoringly. R Safety in place.

## 2015-04-30 NOTE — Progress Notes (Signed)
[facility] Group Notes:  (Nursing/MHT/Case Management/Adjunct)  Date:  04/30/2015  Time:  12:12 AM  Type of Therapy:  Psychoeducational Skills  Participation Level:  Minimal  Participation Quality:  Attentive  Affect:  Flat  Cognitive:  Appropriate  Insight:  Limited  Engagement in Group:  Limited  Modes of Intervention:  Education  Summary of Progress/Problems: The patient verbalized in group that she had a bad day, but that eventually, she coped with her day by socializing with her peers. The patient was unable to state what her relapse prevention strategy will involve.   Andreana Klingerman S 04/30/2015, 12:12 AM

## 2015-04-30 NOTE — [facility] Group Notes (Signed)
04/30/2015  10:00 AM   Type of Therapy and Topic: Group Therapy: Preventing Self Sabotage   Participation Level: Patient not present in group after MHT prompting  Beverly Sessions MSW, LCSW

## 2015-04-30 NOTE — Progress Notes (Signed)
[facility] MD Progress Note  04/30/2015 10:54 AM Jessica Shea  MRN:  [id number]   Subjective:  Patient reports " I am okay, just tired all the time"   Objective:Jessica Shea is awake, alert and oriented X3 , found resting in bedroom.  Denies suicidal or homicidal ideation. Denies auditory or visual hallucination and does not appear to be responding to internal stimuli. . Patient reports she is medication compliant without mediation side effects.. States her depression 9/10. Patient states "I am okay I am just really tired and I am not sleeping at night due to my anxiety." Reports increasing anxiety and thoughts of worries at bedtime. Reports good appetite and states she is resting well though tout the day, but not at night.  Support, encouragement and reassurance was provided.   Principal Problem: MDD (major depressive disorder), recurrent episode, severe ([place]) Diagnosis:   Patient Active Problem List   Diagnosis Date Noted  . MDD (major depressive disorder), recurrent episode, severe ([place]) [F33.2] 04/29/2015  . Microcytic anemia [D50.9] 04/29/2015  . Sinusitis, chronic [J32.9] 04/29/2015  . Hypokalemia [E87.6] 04/29/2015  . Depression with anxiety [F41.8] 04/22/2015   Total Time spent with patient: 30 minutes  Past Psychiatric History: [name]  Past Medical History:  Past Medical History  Diagnosis Date  . Thyroid disease   . Left thyroid nodule 2012  . Sinus bradycardia 2012  . Anemia   . Dyspnea 2012  . Sinusitis, acute ethmoidal    History reviewed. No pertinent past surgical history. Family History: History reviewed. No pertinent family history. Family Psychiatric  History: see above Social History:  History  Alcohol Use No     History  Drug Use No    Social History   Social History  . Marital Status: Single    Spouse Name: N/A  . Number of Children: N/A  . Years of Education: N/A   Social History Main Topics  . Smoking status: Current Every Day Smoker -- 0.25  packs/day    Types: Cigarettes  . Smokeless tobacco: None  . Alcohol Use: No  . Drug Use: No  . Sexual Activity: Yes    Birth Control/ Protection: None   Other Topics Concern  . None   Social History Narrative   Additional Social History:    History of alcohol / drug use?: No history of alcohol / drug abuse                    Sleep: Poor  Appetite:  Fair  Current Medications: Current Facility-Administered Medications  Medication Dose Route Frequency Provider Last Rate Last Dose  . acetaminophen (TYLENOL) tablet 650 mg  650 mg Oral Q6H PRN Harriet Butte, NP   650 mg at 04/29/15 0836  . alum & mag hydroxide-simeth (MAALOX/MYLANTA) 200-200-20 MG/5ML suspension 30 mL  30 mL Oral Q4H PRN Harriet Butte, NP      . amoxicillin (AMOXIL) 250 MG/5ML suspension 500 mg  500 mg Oral 3 times per day Bonnielee Haff, MD   500 mg at 04/30/15 0647  . benzonatate (TESSALON) capsule 100 mg  100 mg Oral BID PRN Harriet Butte, NP      . FLUoxetine (PROZAC) capsule 20 mg  20 mg Oral Daily Harriet Butte, NP   20 mg at 04/30/15 0754  . fluticasone (FLONASE) 50 MCG/ACT nasal spray 2 spray  2 spray Each Nare Daily Bonnielee Haff, MD   2 spray at 04/30/15 0753  . hydrOXYzine (ATARAX/VISTARIL) tablet 25 mg  25 mg Oral TID PRN Harriet Butte, NP   25 mg at 04/29/15 2307  . ibuprofen (ADVIL,MOTRIN) tablet 600 mg  600 mg Oral Q6H PRN Kerrie Buffalo, NP   600 mg at 04/30/15 0754  . [START ON 05/01/2015] Influenza vac split quadrivalent PF (FLUARIX) injection 0.5 mL  0.5 mL Intramuscular Tomorrow-1000 Fernando A Cobos, MD      . magnesium hydroxide (MILK OF MAGNESIA) suspension 30 mL  30 mL Oral Daily PRN Harriet Butte, NP      . nicotine (NICODERM CQ - dosed in mg/24 hours) patch 21 mg  21 mg Transdermal Daily Harriet Butte, NP   21 mg at 04/30/15 0753  . [START ON 05/01/2015] pneumococcal 23 valent vaccine (PNU-IMMUNE) injection 0.5 mL  0.5 mL Intramuscular Tomorrow-1000 Fernando A Cobos, MD       . potassium chloride SA (K-DUR,KLOR-CON) CR tablet 20 mEq  20 mEq Oral BID Jenne Campus, MD   20 mEq at 04/29/15 1719    Lab Results:  Results for orders placed or performed during the hospital encounter of 04/29/15 (from the past 48 hour(s))  Vitamin B12     Status: None   Collection Time: 04/30/15  6:30 AM  Result Value Ref Range   Vitamin B-12 314 180 - 914 pg/mL    Comment: (NOTE) This assay is not validated for testing neonatal or myeloproliferative syndrome specimens for Vitamin B12 levels. Performed at [facility]   Folate     Status: None   Collection Time: 04/30/15  6:30 AM  Result Value Ref Range   Folate 21.5 >5.9 ng/mL    Comment: Performed at [facility]  Iron and TIBC     Status: Abnormal   Collection Time: 04/30/15  6:30 AM  Result Value Ref Range   Iron 12 (L) 28 - 170 ug/dL   TIBC 433 250 - 450 ug/dL   Saturation Ratios 3 (L) 10.4 - 31.8 %   UIBC 421 ug/dL    Comment: Performed at [facility]  Ferritin     Status: Abnormal   Collection Time: 04/30/15  6:30 AM  Result Value Ref Range   Ferritin 6 (L) 11 - 307 ng/mL    Comment: Performed at [facility]  Reticulocytes     Status: None   Collection Time: 04/30/15  6:30 AM  Result Value Ref Range   Retic Ct Pct 1.5 0.4 - 3.1 %   RBC. 4.28 3.87 - 5.11 MIL/uL   Retic Count, Manual 64.2 19.0 - 186.0 K/uL    Comment: Performed at [facility]  CBC     Status: Abnormal   Collection Time: 04/30/15  6:30 AM  Result Value Ref Range   WBC 9.8 4.0 - 10.5 K/uL   RBC 4.28 3.87 - 5.11 MIL/uL   Hemoglobin 8.6 (L) 12.0 - 15.0 g/dL   HCT 30.2 (L) 36.0 - 46.0 %   MCV 70.6 (L) 78.0 - 100.0 fL   MCH 20.1 (L) 26.0 - 34.0 pg   MCHC 28.5 (L) 30.0 - 36.0 g/dL   RDW 18.3 (H) 11.5 - 15.5 %   Platelets 434 (H) 150 - 400 K/uL    Comment: Performed at [facility] metabolic panel     Status: Abnormal   Collection Time: 04/30/15  6:30 AM  Result  Value Ref Range   Sodium 143 135 - 145 mmol/L   Potassium 3.3 (L) 3.5 -  5.1 mmol/L   Chloride 108 101 - 111 mmol/L   CO2 27 22 - 32 mmol/L   Glucose, Bld 93 65 - 99 mg/dL   BUN 8 6 - 20 mg/dL   Creatinine, Ser 0.70 0.44 - 1.00 mg/dL   Calcium 9.3 8.9 - 10.3 mg/dL   GFR calc non Af Amer >60 >60 mL/min   GFR calc Af Amer >60 >60 mL/min    Comment: (NOTE) The eGFR has been calculated using the CKD EPI equation. This calculation has not been validated in all clinical situations. eGFR's persistently <60 mL/min signify possible Chronic Kidney Disease.    Anion gap 8 5 - 15    Comment: Performed at [facility]  Magnesium     Status: None   Collection Time: 04/30/15  6:30 AM  Result Value Ref Range   Magnesium 2.2 1.7 - 2.4 mg/dL    Comment: Performed at [facility]  TSH     Status: None   Collection Time: 04/30/15  6:30 AM  Result Value Ref Range   TSH 2.721 0.350 - 4.500 uIU/mL    Comment: Performed at [facility]    Blood Alcohol level:  Lab Results  Component Value Date   [facility] <5 04/28/2015    Physical Findings: AIMS: Facial and Oral Movements Muscles of Facial Expression: None, normal Lips and Perioral Area: None, normal Jaw: None, normal Tongue: None, normal,Extremity Movements Upper (arms, wrists, hands, fingers): None, normal Lower (legs, knees, ankles, toes): None, normal, Trunk Movements Neck, shoulders, hips: None, normal, Overall Severity Severity of abnormal movements (highest score from questions above): None, normal Incapacitation due to abnormal movements: None, normal Patient's awareness of abnormal movements (rate only patient's report): No Awareness, Dental Status Current problems with teeth and/or dentures?: No (dentures; full set) Does patient usually wear dentures?: Yes  CIWA:  CIWA-Ar Total: 6 COWS:  COWS Total Score: 2  Musculoskeletal: Strength & Muscle Tone: within normal limits Gait &  Station: normal Patient leans: N/A  Psychiatric Specialty Exam: Review of Systems  Psychiatric/Behavioral: Positive for depression. Negative for suicidal ideas. The patient is nervous/anxious.   All other systems reviewed and are negative.   Blood pressure 89/42, pulse 52, temperature 98.3 F (36.8 C), temperature source Oral, resp. rate 18, height '5\' 7"'  (1.702 m), weight 78.926 kg (174 lb), last menstrual period 04/02/2015.Body mass index is 27.25 kg/(m^2).  General Appearance: Fairly Groomed  Engineer, water::  Fair  Speech:  Clear and Coherent  Volume:  Normal  Mood:  Depressed and Hopeless  Affect:  Flat  Thought Process:  Coherent  Orientation:  Full (Time, Place, and Person)  Thought Content:  Rumination with symptoms of worries   Suicidal Thoughts:  No  Homicidal Thoughts:  No  Memory:  Immediate;   Fair Recent;   Fair Remote;   Fair  Judgement:  Fair  Insight:  Fair  Psychomotor Activity:  Restlessness  Concentration:  Fair  Recall:  [employer] of Knowledge:Fair  Language: Good  Akathisia:  No  Handed:  Right  AIMS (if indicated):     Assets:  Communication Skills Desire for Improvement Resilience Social Support  ADL's:  Intact  Cognition: WNL  Sleep:  Number of Hours: 4     I agree with current treatment plan on 04/30/2015, Patient seen face-to-face for psychiatric evaluation follow-up, chart reviewed and case discussed with Internal Medicine. Reviewed the information documented and agree with the treatment plan.  Treatment Plan  Summary: Daily contact with patient to assess and evaluate symptoms and progress in treatment and Medication management Continue Prozac 26ms for mood stabilization. Start with Vistaril 50 mg PO PRN QHS for insomnia Start vitamin B-12 100 mcg, vit C 500 mg Po Q daily and ferrous sulfate 325 mg Po daily  For low lorn levels. -Called consult with Internal Medicine 04/30/2015. Please review notes by MD Gokul & Divine Re-check Potassium  05/02/2015 Will continue to monitor vitals ,medication compliance and treatment side effects while patient is here.  Reviewed labs: Iron/TIBC (asymptomatic) and Potassium decreased ,BAL - , UDS -  CSW will start working on disposition.  Patient to participate in therapeutic milieu  TDerrill Center NP 04/30/2015, 10:54 AM

## 2015-04-30 NOTE — Progress Notes (Signed)
Jessica Shea is having considerable painful stomach cramps. She reports starting her menstrual cycle. Her nasal congestion is slightly better however she is still reporting considerable frontal and maxillary sinus pain. She attended group but has not been very conversant or visible on the unit this evening. Denies SI/HI/AVH. Encouragement and support given. Medications administered as prescribed. Continue Q 15 minute checks for patient safety and medication effectiveness.

## 2015-04-30 NOTE — Progress Notes (Signed)
Adult Psychoeducational Group Note  Date:  04/30/2015 Time:  8:51 PM  Group Topic/Focus:  Wrap-Up Group:   The focus of this group is to help patients review their daily goal of treatment and discuss progress on daily workbooks.  Participation Level:  Active  Participation Quality:  Appropriate  Affect:  Appropriate  Cognitive:  Appropriate  Insight: Appropriate  Engagement in Group:  Engaged  Modes of Intervention:  Discussion  Additional Comments:  Pt stated her goal for today is to get better. Pt stated she is trying it's a work in progress. Pt stated on a positive note, she was able to sleep all night last night which she has not been able to do in about 8 months.  Caswell Corwin 04/30/2015, 8:51 PM

## 2015-05-01 DIAGNOSIS — F332 Major depressive disorder, recurrent severe without psychotic features: Secondary | ICD-10-CM | POA: Insufficient documentation

## 2015-05-01 NOTE — Progress Notes (Signed)
[facility] MD Progress Note  05/01/2015 2:54 PM Jessica Shea  MRN:  [id number]   Subjective:  Patient reports " I am feeling so much better today, than I have in a while."   Objective:Jessica Shea is awake, alert and oriented X3 , found resting in bedroom.  Denies suicidal or homicidal ideation. Denies auditory or visual hallucination and does not appear to be responding to internal stimuli. . Patient reports she is medication compliant without mediation side effects. States her depression 9/10. Reports some anxiety through the day, but it's not bad. Reports that her anxiety and thoughts of worries at bedtime have decreased some. Reports good appetite and states she is resting well throughout and states she had a goodnights rest.  Support, encouragement and reassurance was provided.   Principal Problem: MDD (major depressive disorder), recurrent episode, severe ([place]) Diagnosis:   Patient Active Problem List   Diagnosis Date Noted  . Severe episode of recurrent major depressive disorder, without psychotic features ([place]) [F33.2]   . IDA (iron deficiency anemia) [D50.9]   . MDD (major depressive disorder), recurrent episode, severe ([employer]) [F33.2] 04/29/2015  . Microcytic anemia [D50.9] 04/29/2015  . Sinusitis, chronic [J32.9] 04/29/2015  . Hypokalemia [E87.6] 04/29/2015  . Depression with anxiety [F41.8] 04/22/2015   Total Time spent with patient: 30 minutes  Past Psychiatric History: Jessica Shea  Past Medical History:  Past Medical History  Diagnosis Date  . Thyroid disease   . Left thyroid nodule 2012  . Sinus bradycardia 2012  . Anemia   . Dyspnea 2012  . Sinusitis, acute ethmoidal    History reviewed. No pertinent past surgical history. Family History: History reviewed. No pertinent family history. Family Psychiatric  History: see above Social History:  History  Alcohol Use No     History  Drug Use No    Social History   Social History  . Marital Status: Single    Spouse  Name: N/A  . Number of Children: N/A  . Years of Education: N/A   Social History Main Topics  . Smoking status: Current Every Day Smoker -- 0.25 packs/day    Types: Cigarettes  . Smokeless tobacco: None  . Alcohol Use: No  . Drug Use: No  . Sexual Activity: Yes    Birth Control/ Protection: None   Other Topics Concern  . None   Social History Narrative   Additional Social History:    History of alcohol / drug use?: No history of alcohol / drug abuse                    Sleep: Fair-improving  Appetite:  Fair  Current Medications: Current Facility-Administered Medications  Medication Dose Route Frequency Provider Last Rate Last Dose  . acetaminophen (TYLENOL) tablet 650 mg  650 mg Oral Q6H PRN Harriet Butte, NP   650 mg at 04/29/15 0836  . alum & mag hydroxide-simeth (MAALOX/MYLANTA) 200-200-20 MG/5ML suspension 30 mL  30 mL Oral Q4H PRN Harriet Butte, NP      . amoxicillin (AMOXIL) 250 MG/5ML suspension 500 mg  500 mg Oral 3 times per day Bonnielee Haff, MD   500 mg at 05/01/15 0602  . benzonatate (TESSALON) capsule 100 mg  100 mg Oral BID PRN Harriet Butte, NP      . ferrous sulfate tablet 325 mg  325 mg Oral BID WC Derrill Center, NP   325 mg at 05/01/15 1300  . FLUoxetine (PROZAC) capsule 20 mg  20 mg Oral  Daily Harriet Butte, NP   20 mg at 05/01/15 1300  . fluticasone (FLONASE) 50 MCG/ACT nasal spray 2 spray  2 spray Each Nare Daily Bonnielee Haff, MD   2 spray at 05/01/15 1259  . hydrOXYzine (ATARAX/VISTARIL) tablet 25 mg  25 mg Oral TID PRN Harriet Butte, NP   25 mg at 04/29/15 2307  . hydrOXYzine (ATARAX/VISTARIL) tablet 50 mg  50 mg Oral QHS Derrill Center, NP   50 mg at 04/30/15 2118  . ibuprofen (ADVIL,MOTRIN) tablet 600 mg  600 mg Oral Q6H PRN Kerrie Buffalo, NP   600 mg at 05/01/15 0602  . Influenza vac split quadrivalent PF (FLUARIX) injection 0.5 mL  0.5 mL Intramuscular Tomorrow-1000 Fernando A Cobos, MD      . magnesium hydroxide (MILK OF  MAGNESIA) suspension 30 mL  30 mL Oral Daily PRN Harriet Butte, NP      . nicotine (NICODERM CQ - dosed in mg/24 hours) patch 21 mg  21 mg Transdermal Daily Harriet Butte, NP   21 mg at 05/01/15 0800  . pneumococcal 23 valent vaccine (PNU-IMMUNE) injection 0.5 mL  0.5 mL Intramuscular Tomorrow-1000 Fernando A Cobos, MD      . vitamin B-12 (CYANOCOBALAMIN) tablet 100 mcg  100 mcg Oral Daily Derrill Center, NP   100 mcg at 05/01/15 1301  . vitamin C (ASCORBIC ACID) tablet 500 mg  500 mg Oral Daily Derrill Center, NP   500 mg at 05/01/15 1300    Lab Results:  Results for orders placed or performed during the hospital encounter of 04/29/15 (from the past 48 hour(s))  Vitamin B12     Status: None   Collection Time: 04/30/15  6:30 AM  Result Value Ref Range   Vitamin B-12 314 180 - 914 pg/mL    Comment: (NOTE) This assay is not validated for testing neonatal or myeloproliferative syndrome specimens for Vitamin B12 levels. Performed at [facility]   Folate     Status: None   Collection Time: 04/30/15  6:30 AM  Result Value Ref Range   Folate 21.5 >5.9 ng/mL    Comment: Performed at [facility]  Iron and TIBC     Status: Abnormal   Collection Time: 04/30/15  6:30 AM  Result Value Ref Range   Iron 12 (L) 28 - 170 ug/dL   TIBC 433 250 - 450 ug/dL   Saturation Ratios 3 (L) 10.4 - 31.8 %   UIBC 421 ug/dL    Comment: Performed at [facility]  Ferritin     Status: Abnormal   Collection Time: 04/30/15  6:30 AM  Result Value Ref Range   Ferritin 6 (L) 11 - 307 ng/mL    Comment: Performed at [facility]  Reticulocytes     Status: None   Collection Time: 04/30/15  6:30 AM  Result Value Ref Range   Retic Ct Pct 1.5 0.4 - 3.1 %   RBC. 4.28 3.87 - 5.11 MIL/uL   Retic Count, Manual 64.2 19.0 - 186.0 K/uL    Comment: Performed at [facility]  CBC     Status: Abnormal   Collection Time: 04/30/15  6:30 AM  Result Value Ref Range   WBC 9.8  4.0 - 10.5 K/uL   RBC 4.28 3.87 - 5.11 MIL/uL   Hemoglobin 8.6 (L) 12.0 - 15.0 g/dL   HCT 30.2 (L) 36.0 - 46.0 %   MCV 70.6 (L) 78.0 -  100.0 fL   MCH 20.1 (L) 26.0 - 34.0 pg   MCHC 28.5 (L) 30.0 - 36.0 g/dL   RDW 18.3 (H) 11.5 - 15.5 %   Platelets 434 (H) 150 - 400 K/uL    Comment: Performed at [place] metabolic panel     Status: Abnormal   Collection Time: 04/30/15  6:30 AM  Result Value Ref Range   Sodium 143 135 - 145 mmol/L   Potassium 3.3 (L) 3.5 - 5.1 mmol/L   Chloride 108 101 - 111 mmol/L   CO2 27 22 - 32 mmol/L   Glucose, Bld 93 65 - 99 mg/dL   BUN 8 6 - 20 mg/dL   Creatinine, Ser 0.70 0.44 - 1.00 mg/dL   Calcium 9.3 8.9 - 10.3 mg/dL   GFR calc non Af Amer >60 >60 mL/min   GFR calc Af Amer >60 >60 mL/min    Comment: (NOTE) The eGFR has been calculated using the CKD EPI equation. This calculation has not been validated in all clinical situations. eGFR's persistently <60 mL/min signify possible Chronic Kidney Disease.    Anion gap 8 5 - 15    Comment: Performed at [facility]  Magnesium     Status: None   Collection Time: 04/30/15  6:30 AM  Result Value Ref Range   Magnesium 2.2 1.7 - 2.4 mg/dL    Comment: Performed at [facility]  TSH     Status: None   Collection Time: 04/30/15  6:30 AM  Result Value Ref Range   TSH 2.721 0.350 - 4.500 uIU/mL    Comment: Performed at [facility]    Blood Alcohol level:  Lab Results  Component Value Date   [facility] <5 04/28/2015    Physical Findings: AIMS: Facial and Oral Movements Muscles of Facial Expression: None, normal Lips and Perioral Area: None, normal Jaw: None, normal Tongue: None, normal,Extremity Movements Upper (arms, wrists, hands, fingers): None, normal Lower (legs, knees, ankles, toes): None, normal, Trunk Movements Neck, shoulders, hips: None, normal, Overall Severity Severity of abnormal movements (highest score from  questions above): None, normal Incapacitation due to abnormal movements: None, normal Patient's awareness of abnormal movements (rate only patient's report): No Awareness, Dental Status Current problems with teeth and/or dentures?: No (dentures; full set) Does patient usually wear dentures?: Yes  CIWA:  CIWA-Ar Total: 6 COWS:  COWS Total Score: 2  Musculoskeletal: Strength & Muscle Tone: within normal limits Gait & Station: normal Patient leans: N/A  Psychiatric Specialty Exam: Review of Systems  Psychiatric/Behavioral: Positive for depression. Negative for suicidal ideas and hallucinations. The patient is nervous/anxious.   All other systems reviewed and are negative.   Blood pressure 97/51, pulse 71, temperature 98.7 F (37.1 C), temperature source Oral, resp. rate 16, height '5\' 7"'  (1.702 m), weight 78.926 kg (174 lb), last menstrual period 04/02/2015.Body mass index is 27.25 kg/(m^2).  General Appearance: Casual and Fairly Groomed  Engineer, water::  Fair  Speech:  Clear and Coherent  Volume:  Normal  Mood:  Depressed and Hopeless and anxiety   Affect:  Flat  Thought Process:  Coherent  Orientation:  Full (Time, Place, and Person)  Thought Content:  Rumination with symptoms of worries   Suicidal Thoughts:  No  Homicidal Thoughts:  No  Memory:  Immediate;   Fair Recent;   Fair Remote;   Fair  Judgement:  Fair  Insight:  Fair  Psychomotor Activity:  Restlessness  Concentration:  Fair  Recall:  [employer] of Knowledge:Fair  Language: Good  Akathisia:  No  Handed:  Right  AIMS (if indicated):     Assets:  Communication Skills Desire for Improvement Resilience Social Support  ADL's:  Intact  Cognition: WNL  Sleep:  Number of Hours: 6.75     I agree with current treatment plan on 05/01/2015, Patient seen face-to-face for psychiatric evaluation follow-up, chart reviewed and case discussed with Internal Medicine. Reviewed the information documented and agree with the  treatment plan.  Treatment Plan Summary: Daily contact with patient to assess and evaluate symptoms and progress in treatment and Medication management Continue Prozac 25ms for mood stabilization.-improving Start with Vistaril 50 mg PO PRN QHS for insomnia Continue with vitamin B-12 100 mcg, vit C 500 mg Po Q daily and ferrous sulfate 325 mg Po daily  For low lorn levels. -Called consult with Internal Medicine 04/30/2015. Please review notes by MD Gokul & Divine Re-check Potassium 05/02/2015 Will continue to monitor vitals ,medication compliance and treatment side effects while patient is here.  Reviewed labs: Iron/TIBC (asymptomatic) and Potassium decreased ,BAL - , UDS -  CSW will start working on disposition.  Patient to participate in therapeutic milieu  TDerrill Center NP 05/01/2015, 2:54 PM

## 2015-05-01 NOTE — Progress Notes (Signed)
Patient ID: Jessica Shea, female   DOB: May 11, 1970, 45 y.o.   MRN: [id number] D: Patient alert and cooperative. Pt reports her day was "so so". Pt mood and affect appeared depressed and sad. Pt c/o constipation, milk of magnesia given with no relieve. Pt denies SI/HI/AVH and pain. Pt attended and participated in evening wrap up group. Cooperative with assessment.  A: Met with pt 1:1. Medications administered as prescribed. Support and encouragement provided. Pt encouraged to increase fluid intake. Pt encouraged to discuss feelings and come to staff with any question or concerns.  R: Patient remains safe and complaint with medications.

## 2015-05-01 NOTE — Progress Notes (Signed)
Adult Psychoeducational Group Note  Date:  05/01/2015 Time:  9:50 PM  Group Topic/Focus:  Wrap-Up Group:   The focus of this group is to help patients review their daily goal of treatment and discuss progress on daily workbooks.  Participation Level:  Active  Participation Quality:  Appropriate and Attentive  Affect:  Appropriate  Cognitive:  Appropriate  Insight: Appropriate and Good  Engagement in Group:  Improving  Modes of Intervention:  Discussion  Additional Comments:  Pt rated her day a 5 out of 10. Pt has been dealing with anxiety. Pt goal for tomorrow is to work on going home.   Merlinda Frederick 05/01/2015, 9:50 PM

## 2015-05-01 NOTE — Progress Notes (Signed)
Jessica Shea cont to struggle with her sinusitis.She says she did sleep better  , last night , though. She completed her daily assessment and on it she wrote she denied SI and she rated her depression, hopelessness and anxieity " 5/0/5", respectively. A She is quiet, States she still feels pretty sick...but is present in the group room, off and on, in between the groups. R Safety is in place and poc cont as IM consults support pt and poc.

## 2015-05-01 NOTE — Progress Notes (Signed)
D: Patient alert and oriented x 4. Patient denies SI/HI/AVH. Patient states pain is 9/10 in her lower abdominal area. PRN Ibuprofen given at 1957. Patient had no other complaints during shift.  A: Staff to monitor Q 15 mins for safety. Encouragement and support offered. Scheduled medications administered per orders. R: Patient remains safe on the unit. Patient attended group tonight. Patient visible on the unit and interacting with peers. Patient taking administered medications.

## 2015-05-01 NOTE — Progress Notes (Signed)
Will sign off please call us with questions.  Debbora Presto, MD  [facility] Pager [phone]  If 7PM-7AM, please contact night-coverage [email or web address] Password [email or web address]

## 2015-05-01 NOTE — [facility] Group Notes (Signed)
[facility] Group Notes:  (Nursing/MHT/Case Management/Adjunct)  Date: 04/30/2015  Time: 1300  Type of Therapy:  Nurse Education  /  Life SKills : The group focuses on teaching patients how to identify their needs.  Participation Level:  Minimal  Participation Quality:  Appropriate  Affect:  Blunted  Cognitive:  Alert and Appropriate  Insight:  Appropriate  Engagement in Group:  Engaged  Modes of Intervention:  Discussion and Education  Summary of Progress/Problems:  Jessica Shea 05/01/2015, 9:30 AM  Late entry

## 2015-05-01 NOTE — Plan of Care (Signed)
Problem: Diagnosis: Increased Risk For Suicide Attempt Goal: STG-Patient Will Comply With Medication Regime Outcome: Progressing Pt compliant with medication regime     

## 2015-05-01 NOTE — [facility] Group Notes (Signed)
05/01/2015  10:00 AM   Type of Therapy and Topic: Group Therapy: Developing Self Esteem and Improving Support System   Participation Level: Patient not present after prompting from MHT  Christinia Lambeth Hulan Fray MSW, LCSW

## 2015-05-02 LAB — POTASSIUM: Potassium: 3.8 mmol/L (ref 3.5–5.1)

## 2015-05-02 MED ORDER — FLUOXETINE HCL 10 MG PO CAPS
30.0000 mg | ORAL_CAPSULE | Freq: Every day | ORAL | Status: DC
  Administered 2015-05-03: 30 mg via ORAL
  Filled 2015-05-02 (×3): qty 3

## 2015-05-02 MED ORDER — TRAZODONE HCL 50 MG PO TABS
50.0000 mg | ORAL_TABLET | Freq: Every evening | ORAL | Status: DC | PRN
  Administered 2015-05-02 (×2): 50 mg via ORAL
  Filled 2015-05-02 (×7): qty 1

## 2015-05-02 MED ORDER — BUSPIRONE HCL 5 MG PO TABS
5.0000 mg | ORAL_TABLET | Freq: Three times a day (TID) | ORAL | Status: DC
  Administered 2015-05-02 – 2015-05-05 (×10): 5 mg via ORAL
  Filled 2015-05-02 (×3): qty 1
  Filled 2015-05-02: qty 21
  Filled 2015-05-02 (×7): qty 1
  Filled 2015-05-02: qty 21
  Filled 2015-05-02 (×2): qty 1
  Filled 2015-05-02: qty 21
  Filled 2015-05-02: qty 1

## 2015-05-02 NOTE — Progress Notes (Signed)
Patient ID: Jessica Shea, female   DOB: 1970/10/11, 45 y.o.   MRN: [id number]   Pt currently presents with an appropriate affect and depressed behavior. Per self inventory, pt rates depression, hopelessness and anxiety at a 9. Pt's daily goal is to "feel better" and they intend to do so by "pray." Pt reports fair sleep, a poor appetite, low energy and poor concentration. Pt still reports respiratory congestion and sore throat .  Pt provided with medications per providers orders. Pt's labs and vitals were monitored throughout the day. Pt supported emotionally and encouraged to express concerns and questions. Pt educated on medications.  Pt's safety ensured with 15 minute and environmental checks. Pt currently denies SI/HI and A/V hallucinations. Pt verbally agrees to seek staff if SI/HI or A/VH occurs and to consult with staff before acting on these thoughts. Will continue POC.

## 2015-05-02 NOTE — [facility] Group Notes (Signed)
[facility] LCSW Group Therapy 05/02/2015  1:15 pm  Type of Therapy: Group Therapy Participation Level: Minimal  Participation Quality: Minimal  Affect: Depressed and Flat  Cognitive: Alert and Oriented  Insight: Developing/Improving and Engaged  Engagement in Therapy: Developing/Improving and Engaged  Modes of Intervention: Clarification, Confrontation, Discussion, Education, Exploration,  Limit-setting, Orientation, Problem-solving, Rapport Building, Dance movement psychotherapist, Socialization and Support  Summary of Progress/Problems: Pt identified obstacles faced currently and processed barriers involved in overcoming these obstacles. Pt identified steps necessary for overcoming these obstacles and explored motivation (internal and external) for facing these difficulties head on. Pt further identified one area of concern in their lives and chose a goal to focus on for today. Patient identified isolation as an obstacle and states that her goal is "to get well". She is unsure how she will do this. CSW reinforced the importance of medication management and support. Patient participated minimally in discussion.   Samuella Bruin, LCSW Clinical Social Worker [facility] [phone]

## 2015-05-02 NOTE — Progress Notes (Addendum)
Patient ID: Jessica Shea, female   DOB: [birth year], 45 y.o.   MRN: [id number] [facility] MD Progress Note  05/02/2015 3:28 PM Jessica Shea  MRN:  [id number]   Subjective:  Patient reports partial improvement of her mood ,  but states she is less depressed.  She denies medication side effects and does feel medications are helping. Remains somatically focused and states she continues to feel her nose is congested, although she does state that she is now better able to breathe nasally. Also reports constipation.    Objective: I have discussed case with treatment team and have met with patient. She is presenting with ongoing depression, but states she is feeling better, and her affect presents fuller in range, smiles at times appropriately, and also has better eye contact and improved rate of speech compared to admission. Remains concerned about nasal congestion, but is noted to be breathing nasally without any symptoms of dyspnea or any noticeable congestion. Prior to admission she had been using nasal sprays , and we have reviewed potential side effects and paradoxical nasal dryness and rebound congestion associated with these medications- she is encouraged to use saline nasal spray if needed . Denies medication side effects, feels medications are helping . Currently on Iron supplementation for iron deficiency anemia.  Group participation has increased but remains limited.  Denies any suicidal ideations . Labs - K+ 3.8 - WNL   Principal Problem: MDD (major depressive disorder), recurrent episode, severe ([place]) Diagnosis:   Patient Active Problem List   Diagnosis Date Noted  . Severe episode of recurrent major depressive disorder, without psychotic features ([place]) [F33.2]   . IDA (iron deficiency anemia) [D50.9]   . MDD (major depressive disorder), recurrent episode, severe ([place]) [F33.2] 04/29/2015  . Microcytic anemia [D50.9] 04/29/2015  . Sinusitis, chronic [J32.9] 04/29/2015  . Hypokalemia [E87.6]  04/29/2015  . Depression with anxiety [F41.8] 04/22/2015   Total Time spent with patient: 25 minutes   Past Psychiatric History: Jessica Shea  Past Medical History:  Past Medical History  Diagnosis Date  . Thyroid disease   . Left thyroid nodule 2012  . Sinus bradycardia 2012  . Anemia   . Dyspnea 2012  . Sinusitis, acute ethmoidal    History reviewed. No pertinent past surgical history. Family History: History reviewed. No pertinent family history. Family Psychiatric  History: see above Social History:  History  Alcohol Use No     History  Drug Use No    Social History   Social History  . Marital Status: Single    Spouse Name: N/A  . Number of Children: N/A  . Years of Education: N/A   Social History Main Topics  . Smoking status: Current Every Day Smoker -- 0.25 packs/day    Types: Cigarettes  . Smokeless tobacco: None  . Alcohol Use: No  . Drug Use: No  . Sexual Activity: Yes    Birth Control/ Protection: None   Other Topics Concern  . None   Social History Narrative   Additional Social History:    History of alcohol / drug use?: No history of alcohol / drug abuse  Sleep: improving  Appetite:   Improving   Current Medications: Current Facility-Administered Medications  Medication Dose Route Frequency Provider Last Rate Last Dose  . acetaminophen (TYLENOL) tablet 650 mg  650 mg Oral Q6H PRN Harriet Butte, NP   650 mg at 04/29/15 0836  . alum & mag hydroxide-simeth (MAALOX/MYLANTA) 200-200-20 MG/5ML suspension 30 mL  30 mL Oral Q4H  PRN Harriet Butte, NP   30 mL at 05/02/15 1428  . amoxicillin (AMOXIL) 250 MG/5ML suspension 500 mg  500 mg Oral 3 times per day Bonnielee Haff, MD   500 mg at 05/02/15 1430  . benzonatate (TESSALON) capsule 100 mg  100 mg Oral BID PRN Harriet Butte, NP      . busPIRone (BUSPAR) tablet 5 mg  5 mg Oral TID Jenne Campus, MD   5 mg at 05/02/15 1202  . ferrous sulfate tablet 325 mg  325 mg Oral BID WC Derrill Center, NP    325 mg at 05/02/15 0824  . [START ON 05/03/2015] FLUoxetine (PROZAC) capsule 30 mg  30 mg Oral Daily Myer Peer Elzina Devera, MD      . fluticasone (FLONASE) 50 MCG/ACT nasal spray 2 spray  2 spray Each Nare Daily Bonnielee Haff, MD   2 spray at 05/02/15 0824  . hydrOXYzine (ATARAX/VISTARIL) tablet 25 mg  25 mg Oral TID PRN Harriet Butte, NP   25 mg at 05/01/15 2319  . hydrOXYzine (ATARAX/VISTARIL) tablet 50 mg  50 mg Oral QHS Derrill Center, NP   50 mg at 05/01/15 2143  . ibuprofen (ADVIL,MOTRIN) tablet 600 mg  600 mg Oral Q6H PRN Kerrie Buffalo, NP   600 mg at 05/01/15 0602  . magnesium hydroxide (MILK OF MAGNESIA) suspension 30 mL  30 mL Oral Daily PRN Harriet Butte, NP   30 mL at 05/01/15 2049  . nicotine (NICODERM CQ - dosed in mg/24 hours) patch 21 mg  21 mg Transdermal Daily Harriet Butte, NP   21 mg at 05/02/15 0827  . vitamin B-12 (CYANOCOBALAMIN) tablet 100 mcg  100 mcg Oral Daily Derrill Center, NP   100 mcg at 05/02/15 0825  . vitamin C (ASCORBIC ACID) tablet 500 mg  500 mg Oral Daily Derrill Center, NP   500 mg at 05/02/15 7741    Lab Results:  Results for orders placed or performed during the hospital encounter of 04/29/15 (from the past 48 hour(s))  Potassium     Status: None   Collection Time: 05/02/15  6:15 AM  Result Value Ref Range   Potassium 3.8 3.5 - 5.1 mmol/L    Comment: Performed at [facility]    Blood Alcohol level:  Lab Results  Component Value Date   [facility] <5 04/28/2015    Physical Findings: AIMS: Facial and Oral Movements Muscles of Facial Expression: None, normal Lips and Perioral Area: None, normal Jaw: None, normal Tongue: None, normal,Extremity Movements Upper (arms, wrists, hands, fingers): None, normal Lower (legs, knees, ankles, toes): None, normal, Trunk Movements Neck, shoulders, hips: None, normal, Overall Severity Severity of abnormal movements (highest score from questions above): None, normal Incapacitation due to abnormal  movements: None, normal Patient's awareness of abnormal movements (rate only patient's report): No Awareness, Dental Status Current problems with teeth and/or dentures?: No (dentures; full set) Does patient usually wear dentures?: Yes  CIWA:  CIWA-Ar Total: 6 COWS:  COWS Total Score: 2  Musculoskeletal: Strength & Muscle Tone: within normal limits Gait & Station: normal Patient leans: N/A  Psychiatric Specialty Exam: Review of Systems  Psychiatric/Behavioral: Positive for depression. Negative for suicidal ideas and hallucinations. The patient is nervous/anxious.   All other systems reviewed and are negative. reports ongoing nasal congestion   Blood pressure 112/73, pulse 69, temperature 98.5 F (36.9 C), temperature source Oral, resp. rate 16, height '5\' 7"'  (1.702 m), weight 174  lb (78.926 kg), last menstrual period 04/02/2015.Body mass index is 27.25 kg/(m^2).  General Appearance: grooming has improved compared to admission  Eye Contact::  Good   Speech:  Clear and Coherent  Volume:  Normal  Mood:  Still depressed, but partially improved compared to admission  Affect:  Constricted but reactive, smiles at times appropriately   Thought Process:  Coherent  Orientation:  Full (Time, Place, and Person)  Thought Content:  Rumination - no hallucinations, no delusions expressed   Suicidal Thoughts:  No denies any suicidal ideations, denies any self injurious ideations  Homicidal Thoughts:  No  Memory:  Recent and remote grossly intact   Judgement:  Improving   Insight:  Fair  Psychomotor Activity:  Decreased   Concentration:  Good  Recall:  Good  Fund of Knowledge:Good  Language: Good  Akathisia:  No  Handed:  Right  AIMS (if indicated):     Assets:  Communication Skills Desire for Improvement Resilience Social Support  ADL's:  Intact  Cognition: WNL  Sleep:  Number of Hours: 5.75   Assessment- patient presenting with partial improvement compared to admission. Although  remains vaguely depressed, anxious, somatically focused, does present with a fuller range of affect, smiles at times appropriately, and eye contact, grooming, and rate of speech are improved. At this time tolerating Prozac and Buspar well- we reviewed medication side effects and rationale for treatment.   Treatment Plan Summary: Daily contact with patient to assess and evaluate symptoms and progress in treatment and Medication management Increase Prozac to 30 mgs QAM  for mood stabilization. Continue Buspar 5 mgrs TID for anxiety, worry Continue  Vistaril 50 mg  QHS PRN  for insomnia Continue  vitamin B-12 100 mcg, vit C ,and Iron supplementation Treatment team working on disposition planning   Neita Garnet, MD 05/02/2015, 3:28 PM

## 2015-05-02 NOTE — Progress Notes (Signed)
D:Patient in the dayroom on approach.  Patient appears sad and depressed.  Patient states her main stressor is she hs constant sinus congestion and it has lasted for 2 months or more.  Patient states she has tried everything and nothing is helping. Patient states if her sinuses would clear up she would feel better.  Patient also stated she has not been sleeping well at night. Patient denies SI/HI and denies AVH. A: Staff to monitor Q 15 mins for safety.  Encouragement and support offered.  Scheduled medications administered per orders.   R: Patient remains safe on the unit.  Patient attended group tonight.  Patient visible on the unit and interacting with peers.  Patient taking administered medications
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
## 2015-05-02 NOTE — BHH Suicide Risk Assessment (Signed)
[facility] INPATIENT:  Family/Significant Other Suicide Prevention Education  Suicide Prevention Education:  Patient Refusal for Family/Significant Other Suicide Prevention Education: The patient Prue Vos has refused to provide written consent for family/significant other to be provided Family/Significant Other Suicide Prevention Education during admission and/or prior to discharge.  Physician notified. SPE reviewed with patient and brochure provided. Patient encouraged to return to hospital if having suicidal thoughts, patient verbalized his/her understanding and has no further questions at this time.   Rey Dansby, [name] 05/02/2015, 11:55 AM

## 2015-05-02 NOTE — BHH Group Notes (Signed)
[facility] LCSW Aftercare Discharge Planning Group Note  05/02/2015  8:45 AM  Participation Quality: Did Not Attend. Patient invited to participate but declined.  Huntley Knoop, MSW, LCSW Clinical Social Worker [facility] [phone]   

## 2015-05-02 NOTE — Progress Notes (Signed)
Adult Psychoeducational Group Note  Date:  05/02/2015 Time:  8:15 PM  Group Topic/Focus:  Wrap-Up Group:   The focus of this group is to help patients review their daily goal of treatment and discuss progress on daily workbooks.  Participation Level:  Active  Participation Quality:  Appropriate  Affect:  Appropriate  Cognitive:  Appropriate  Insight: Appropriate  Engagement in Group:  Engaged  Modes of Intervention:  Discussion  Additional Comments:  Pt was pleasant during wrap-up group. Pt rated her overall day a 5 out of 10 because it was just a normal day. Pt reported that she did not achieve her goal for the day, which was "to get rid of my sinus".   Cleotilde Neer 05/02/2015, 8:54 PM

## 2015-05-03 MED ORDER — TRAZODONE HCL 50 MG PO TABS
25.0000 mg | ORAL_TABLET | Freq: Every evening | ORAL | Status: DC | PRN
  Administered 2015-05-03 – 2015-05-04 (×2): 25 mg via ORAL
  Filled 2015-05-03: qty 1
  Filled 2015-05-03: qty 4
  Filled 2015-05-03: qty 1

## 2015-05-03 MED ORDER — FLUOXETINE HCL 20 MG PO CAPS
40.0000 mg | ORAL_CAPSULE | Freq: Every day | ORAL | Status: DC
  Administered 2015-05-04 – 2015-05-05 (×2): 40 mg via ORAL
  Filled 2015-05-03: qty 2
  Filled 2015-05-03: qty 14
  Filled 2015-05-03 (×2): qty 2

## 2015-05-03 NOTE — Progress Notes (Signed)
Patient ID: Jessica Shea, female   DOB: [birth year], 45 y.o.   MRN: [id number] [facility] MD Progress Note  05/03/2015 4:41 PM Roland Blankenhorn  MRN:  [id number]   Subjective:  Patient reports some improvement , but does continue to report sadness, depression. Reports ongoing sinus symptoms , and states she feels there is a blockage on her R nostril. She  denies medication side effects and does feel medications are helping partially- has tolerated Prozac titration well.    Objective: I have discussed case with treatment team and have met with patient. She is presenting with  Partially improved mood and more reactive affect. She smiles more often and presents with improved eye contact . She does report ongoing depression related to physical symptoms, particular nasal/ sinus symptoms as above . Of note, patient appears to be breathing nasally without apparent rhinorrhea, congestion, or nasal speech.  She has been visible in day room, and has been going to some groups. Behavior on unit in good control. Tolerating medications well . More future oriented today, and discussed plans for after discharge- states " I know I need to see an ENT specialist , but I need to wait until my work insurance comes through".  Denies any SI at this time. Repeat K+ level 3.8   Principal Problem: MDD (major depressive disorder), recurrent episode, severe ([place]) Diagnosis:   Patient Active Problem List   Diagnosis Date Noted  . Severe episode of recurrent major depressive disorder, without psychotic features ([place]) [F33.2]   . IDA (iron deficiency anemia) [D50.9]   . MDD (major depressive disorder), recurrent episode, severe ([place]) [F33.2] 04/29/2015  . Microcytic anemia [D50.9] 04/29/2015  . Sinusitis, chronic [J32.9] 04/29/2015  . Hypokalemia [E87.6] 04/29/2015  . Depression with anxiety [F41.8] 04/22/2015   Total Time spent with patient: 25 minutes   Past Psychiatric History: [name]  Past Medical History:  Past  Medical History  Diagnosis Date  . Thyroid disease   . Left thyroid nodule 2012  . Sinus bradycardia 2012  . Anemia   . Dyspnea 2012  . Sinusitis, acute ethmoidal    History reviewed. No pertinent past surgical history. Family History: History reviewed. No pertinent family history. Family Psychiatric  History: see above Social History:  History  Alcohol Use No     History  Drug Use No    Social History   Social History  . Marital Status: Single    Spouse Name: N/A  . Number of Children: N/A  . Years of Education: N/A   Social History Main Topics  . Smoking status: Current Every Day Smoker -- 0.25 packs/day    Types: Cigarettes  . Smokeless tobacco: None  . Alcohol Use: No  . Drug Use: No  . Sexual Activity: Yes    Birth Control/ Protection: None   Other Topics Concern  . None   Social History Narrative   Additional Social History:    History of alcohol / drug use?: No history of alcohol / drug abuse  Sleep: improving  Appetite:   Improving   Current Medications: Current Facility-Administered Medications  Medication Dose Route Frequency Provider Last Rate Last Dose  . acetaminophen (TYLENOL) tablet 650 mg  650 mg Oral Q6H PRN Harriet Butte, NP   650 mg at 04/29/15 0836  . alum & mag hydroxide-simeth (MAALOX/MYLANTA) 200-200-20 MG/5ML suspension 30 mL  30 mL Oral Q4H PRN Harriet Butte, NP   30 mL at 05/02/15 1428  . amoxicillin (AMOXIL) 250 MG/5ML suspension 500  mg  500 mg Oral 3 times per day Bonnielee Haff, MD   500 mg at 05/03/15 1608  . benzonatate (TESSALON) capsule 100 mg  100 mg Oral BID PRN Harriet Butte, NP      . busPIRone (BUSPAR) tablet 5 mg  5 mg Oral TID Jenne Campus, MD   5 mg at 05/03/15 1603  . ferrous sulfate tablet 325 mg  325 mg Oral BID WC Derrill Center, NP   325 mg at 05/03/15 1603  . FLUoxetine (PROZAC) capsule 30 mg  30 mg Oral Daily Jenne Campus, MD   30 mg at 05/03/15 5621  . fluticasone (FLONASE) 50 MCG/ACT nasal spray  2 spray  2 spray Each Nare Daily Bonnielee Haff, MD   2 spray at 05/03/15 0809  . hydrOXYzine (ATARAX/VISTARIL) tablet 25 mg  25 mg Oral TID PRN Harriet Butte, NP   25 mg at 05/01/15 2319  . hydrOXYzine (ATARAX/VISTARIL) tablet 50 mg  50 mg Oral QHS Derrill Center, NP   50 mg at 05/02/15 2140  . ibuprofen (ADVIL,MOTRIN) tablet 600 mg  600 mg Oral Q6H PRN Kerrie Buffalo, NP   600 mg at 05/01/15 0602  . magnesium hydroxide (MILK OF MAGNESIA) suspension 30 mL  30 mL Oral Daily PRN Harriet Butte, NP   30 mL at 05/03/15 1606  . nicotine (NICODERM CQ - dosed in mg/24 hours) patch 21 mg  21 mg Transdermal Daily Harriet Butte, NP   21 mg at 05/03/15 0810  . traZODone (DESYREL) tablet 50 mg  50 mg Oral QHS,MR X 1 Laverle Hobby, PA-C   50 mg at 05/02/15 2233  . vitamin B-12 (CYANOCOBALAMIN) tablet 100 mcg  100 mcg Oral Daily Derrill Center, NP   100 mcg at 05/03/15 0813  . vitamin C (ASCORBIC ACID) tablet 500 mg  500 mg Oral Daily Derrill Center, NP   500 mg at 05/03/15 3086    Lab Results:  Results for orders placed or performed during the hospital encounter of 04/29/15 (from the past 48 hour(s))  Potassium     Status: None   Collection Time: 05/02/15  6:15 AM  Result Value Ref Range   Potassium 3.8 3.5 - 5.1 mmol/L    Comment: Performed at [facility]    Blood Alcohol level:  Lab Results  Component Value Date   [facility] <5 04/28/2015    Physical Findings: AIMS: Facial and Oral Movements Muscles of Facial Expression: None, normal Lips and Perioral Area: None, normal Jaw: None, normal Tongue: None, normal,Extremity Movements Upper (arms, wrists, hands, fingers): None, normal Lower (legs, knees, ankles, toes): None, normal, Trunk Movements Neck, shoulders, hips: None, normal, Overall Severity Severity of abnormal movements (highest score from questions above): None, normal Incapacitation due to abnormal movements: None, normal Patient's awareness of abnormal movements  (rate only patient's report): No Awareness, Dental Status Current problems with teeth and/or dentures?: No (dentures; full set) Does patient usually wear dentures?: Yes  CIWA:  CIWA-Ar Total: 6 COWS:  COWS Total Score: 2  Musculoskeletal: Strength & Muscle Tone: within normal limits Gait & Station: normal Patient leans: N/A  Psychiatric Specialty Exam: Review of Systems  Psychiatric/Behavioral: Positive for depression. Negative for suicidal ideas and hallucinations. The patient is nervous/anxious.   All other systems reviewed and are negative. reports ongoing nasal congestion   Blood pressure 98/67, pulse 70, temperature 98.5 F (36.9 C), temperature source Oral, resp. rate 16, height 5'  7" (1.702 m), weight 174 lb (78.926 kg), last menstrual period 04/02/2015.Body mass index is 27.25 kg/(m^2).  General Appearance: grooming has improved compared to admission  Eye Contact::  Good   Speech:  Clear and Coherent  Volume:  Normal  Mood:  Depressed, but improved compared to admission  Affect:  Constricted but  Becoming more reactive, smiles at times appropriately   Thought Process:  Linear   Orientation:  Full (Time, Place, and Person)  Thought Content:  Rumination - no hallucinations, no delusions expressed   Suicidal Thoughts:  No denies any suicidal ideations, denies any self injurious ideations  Homicidal Thoughts:  No  Memory:  Recent and remote grossly intact   Judgement:  Improving   Insight:  Fair  Psychomotor Activity:  Improved   Concentration:  Good  Recall:  Good  Fund of Knowledge:Good  Language: Good  Akathisia:  No  Handed:  Right  AIMS (if indicated):     Assets:  Communication Skills Desire for Improvement Resilience Social Support  ADL's:  Intact  Cognition: WNL  Sleep:  Number of Hours: 6.25   Assessment- patient continues to present with some depression, somatic ruminations, but compared to admission presentation has improved and is more future oriented,  focusing more on disposition options and when she will be returning to work. . At this time tolerating Prozac and Buspar well, without reported side effects.  Treatment Plan Summary: Daily contact with patient to assess and evaluate symptoms and progress in treatment and Medication management Increase Prozac to 40  mgs QAM  for depression, anxiety Continue Buspar 5 mgrs TID for anxiety, worry Continue  Vistaril 50 mg  QHS PRN  for insomnia Continue  vitamin B-12 100 mcg, vit C ,and Iron supplementation Treatment team working on disposition planning   Neita Garnet, MD 05/03/2015, 4:41 PM

## 2015-05-03 NOTE — Plan of Care (Signed)
Problem: Consults Goal: Depression Patient Education See Patient Education Module for education specifics.  Outcome: Progressing Nurse discussed depression/coping skills with patient.        

## 2015-05-03 NOTE — [facility] Group Notes (Signed)
[facility] LCSW Group Therapy  05/03/2015   1:15 PM   Type of Therapy:  Group Therapy  Participation Level:  Active  Participation Quality:  Attentive, Sharing and Supportive  Affect:  Depressed and Flat  Cognitive:  Alert and Oriented  Insight:  Developing/Improving and Engaged  Engagement in Therapy:  Developing/Improving and Engaged  Modes of Intervention:  Clarification, Confrontation, Discussion, Education, Exploration, Limit-setting, Orientation, Problem-solving, Rapport Building, Dance movement psychotherapist, Socialization and Support  Summary of Progress/Problems: The topic for group therapy was feelings about diagnosis.  Pt actively participated in group discussion on their past and current diagnosis and how they feel towards this.  Pt also identified how society and family members judge them, based on their diagnosis as well as stereotypes and stigmas.  Patient discussed how her anxiety and depression have caused her to isolate from her supports and discussed having difficulty sleeping. She feels that her family and friends do not understand her illness. CSW and other group members provided patient with emotional support and encouragement.   Samuella Bruin, MSW, LCSW Clinical Social Worker [facility] [phone]

## 2015-05-03 NOTE — Progress Notes (Signed)
Adult Psychoeducational Group Note  Date:  05/03/2015 Time:  9:37 PM  Group Topic/Focus:  Wrap-Up Group:   The focus of this group is to help patients review their daily goal of treatment and discuss progress on daily workbooks.  Participation Level:  Minimal  Participation Quality:  Appropriate  Affect:  Appropriate  Cognitive:  Alert  Insight: Appropriate  Engagement in Group:  Lacking  Modes of Intervention:  Discussion  Additional Comments:  Pt stated that she feels better today then she has been feeling. Her goal is to continue to deal with her depression.   Flonnie Hailstone 05/03/2015, 9:37 PM

## 2015-05-03 NOTE — Progress Notes (Signed)
D:  Patient's self inventory sheet, patient has fair sleep, sleep medication is helpful.  Poor appetite, low energy level, good concentration.  Rated depression, hopeless and anxiety 5.  Denied withdrawals.  Denied SI.  Denied physical problems.  Physical problems sinus, stomach.  Worst pain in past 24 hours is #8, sinus.  Pain medication is not helpful.  Goal is to feel better.  Plans to pray.  No discharge plans.  Anxiety will be problem after discharge. A:  Medications administered per MD orders.  Emotional support and encouragement given patient. R:  Denied SI and HI, contracts for safety.  Denied A/V hallucinations.  Safety maintained with 15 minute checks.

## 2015-05-03 NOTE — [facility] Group Notes (Signed)
The focus of this group is to educate the patient on the purpose and policies of crisis stabilization and provide a format to answer questions about their admission.  The group details unit policies and expectations of patients while admitted.  Patient did not attend 0900 nurse education orientation group this morning.  Patient stayed in bed.   

## 2015-05-04 MED ORDER — MAGNESIUM CITRATE PO SOLN
1.0000 | Freq: Once | ORAL | Status: AC | PRN
  Administered 2015-05-04: 1 via ORAL

## 2015-05-04 NOTE — Progress Notes (Signed)
Adult Psychoeducational Group Note  Date:  05/04/2015 Time:  9:11 PM  Group Topic/Focus:  Wrap-Up Group:   The focus of this group is to help patients review their daily goal of treatment and discuss progress on daily workbooks.  Participation Level:  Minimal  Participation Quality:  Appropriate  Affect:  Appropriate  Cognitive:  Alert  Insight: Appropriate  Engagement in Group:  Engaged  Modes of Intervention:  Discussion  Additional Comments:  Pt stated that she feels better today. She is feeling better about going back home and returning to work.   Kaleen Odea R 05/04/2015, 9:11 PM

## 2015-05-04 NOTE — [facility] Group Notes (Signed)
[facility] LCSW Aftercare Discharge Planning Group Note  05/04/2015  8:45 AM  Participation Quality: Did Not Attend. Patient invited to participate but declined.  Jayton Popelka, MSW, LCSW Clinical Social Worker [facility] [phone]   

## 2015-05-04 NOTE — Progress Notes (Signed)
D: Pt presents depressed in affect and mood. Pt is visible within the milieu but with minimal interactions. Pt is currently denying any SI/HI/AVH. Pt verbally contracts for safety. Pt complains of generalized pain which results in some relief with motrin and heating packs.  A: Writer administered scheduled and prn medications to pt, per MD orders. Continued support and availability as needed was extended to this pt. Staff continues to monitor pt with q79min checks.  R: No adverse drug reactions noted. Pt receptive to treatment. Pt remains safe at this time.

## 2015-05-04 NOTE — [facility] Group Notes (Signed)
[facility] LCSW Group Therapy 05/04/2015  1:15 pm   Type of Therapy: Group Therapy Participation Level: Minimal  Participation Quality: Attentive, Sharing  Affect: Depressed and Flat  Cognitive: Alert and Oriented  Insight: Developing/Improving and Engaged  Engagement in Therapy: Developing/Improving and Engaged  Modes of Intervention: Clarification, Confrontation, Discussion, Education, Exploration, Limit-setting, Orientation, Problem-solving, Rapport Building, Dance movement psychotherapist, Socialization and Support  Summary of Progress/Problems: The topic for group was balance in life. Today's group focused on defining balance in one's own words, identifying things that can knock one off balance, and exploring healthy ways to maintain balance in life. Group members were asked to provide an example of a time when they felt off balance, describe how they handled that situation,and process healthier ways to regain balance in the future. Group members were asked to share the most important tool for maintaining balance that they learned while at [facility] and how they plan to apply this method after discharge. Patient participated minimally in discussion but stated that she finds peace in nature. CSW and other group members provided patient with emotional support and encouragement.  Samuella Bruin, MSW, LCSW Clinical Social Worker [facility] [phone]

## 2015-05-04 NOTE — Progress Notes (Signed)
Patient ID: Jessica Shea, female   DOB: 1970-10-31, 45 y.o.   MRN: [id number]  DAR: Pt. Denies SI/HI and A/V Hallucinations. She reports sleep was fair, appetite is fair, energy level is low, and concentration is poor. She rates depression, anxiety, and hopelessness 5/10. Patient does not report pain at this time. Support and encouragement provided to the patient. Scheduled medications administered to patient per physician's orders. Patient reports constipation and received PRN Milk of Magnesia however reported no relief. Order was obtained and Magnesium citrate administered. Patient is cooperative however remains minimal. She is seen in the milieu more as the day goes on. Q15 minute checks are maintained for safety.

## 2015-05-04 NOTE — Progress Notes (Signed)
Recreation Therapy Notes  Date: 03.01.2017 Time: 9:30am Location: [address] Hall Group Room   Group Topic: Stress Management  Goal Area(s) Addresses:  Patient will actively participate in stress management techniques presented during session.   Behavioral Response: Did not attend.   Chrisann Melaragno L Ehab Humber, LRT/CTRS        Zahria Ding L 05/04/2015 11:54 AM 

## 2015-05-04 NOTE — Progress Notes (Signed)
Patient ID: Jessica Shea, female   DOB: 26-Apr-1970, 45 y.o.   MRN: [id number] [facility] MD Progress Note  05/04/2015 4:12 PM Cherylyn Curtiss  MRN:  [id number]   Subjective:  Patient continues to reports a sense of gradual improvement , and states she is less depressed . Today presents less somatically focused, although still reporting  sinus symptoms ,  and vague, nondescript aches, pains.  She  denies medication side effects .     Objective: I have discussed case with treatment team and have met with patient. She remains depressed, but affect has improved compared to admission and presents with a much more reactive affect . She is also more future oriented, today focusing more on discharge planning, stating she is feeling she can be discharged soon. Plans to return home, and is wanting to restart work soon after her discharge. As above, although still somatically focused, less significantly ruminative about nasal, sinus symptoms. Does not appear to be in any acute distress/discomfort .  Future oriented, states she plans to establish PCP care after discharge and go to ENT specialist .   Although tends to isolate and has limited participation in groups, has remained visible in day room, and has been going to some groups. No disruptive or agitated behaviors on unit . Tolerating medications well at this time, denies medication side effects .   Principal Problem: MDD (major depressive disorder), recurrent episode, severe ([place]) Diagnosis:   Patient Active Problem List   Diagnosis Date Noted  . Severe episode of recurrent major depressive disorder, without psychotic features ([place]) [F33.2]   . IDA (iron deficiency anemia) [D50.9]   . MDD (major depressive disorder), recurrent episode, severe ([place]) [F33.2] 04/29/2015  . Microcytic anemia [D50.9] 04/29/2015  . Sinusitis, chronic [J32.9] 04/29/2015  . Hypokalemia [E87.6] 04/29/2015  . Depression with anxiety [F41.8] 04/22/2015   Total Time spent with  patient: 25 minutes   Past Psychiatric History: [name]  Past Medical History:  Past Medical History  Diagnosis Date  . Thyroid disease   . Left thyroid nodule 2012  . Sinus bradycardia 2012  . Anemia   . Dyspnea 2012  . Sinusitis, acute ethmoidal    History reviewed. No pertinent past surgical history. Family History: History reviewed. No pertinent family history. Family Psychiatric  History: see above Social History:  History  Alcohol Use No     History  Drug Use No    Social History   Social History  . Marital Status: Single    Spouse Name: N/A  . Number of Children: N/A  . Years of Education: N/A   Social History Main Topics  . Smoking status: Current Every Day Smoker -- 0.25 packs/day    Types: Cigarettes  . Smokeless tobacco: None  . Alcohol Use: No  . Drug Use: No  . Sexual Activity: Yes    Birth Control/ Protection: None   Other Topics Concern  . None   Social History Narrative   Additional Social History:    History of alcohol / drug use?: No history of alcohol / drug abuse  Sleep: improving  Appetite:   Improving   Current Medications: Current Facility-Administered Medications  Medication Dose Route Frequency Provider Last Rate Last Dose  . acetaminophen (TYLENOL) tablet 650 mg  650 mg Oral Q6H PRN Harriet Butte, NP   650 mg at 04/29/15 0836  . alum & mag hydroxide-simeth (MAALOX/MYLANTA) 200-200-20 MG/5ML suspension 30 mL  30 mL Oral Q4H PRN Harriet Butte, NP  30 mL at 05/02/15 1428  . benzonatate (TESSALON) capsule 100 mg  100 mg Oral BID PRN Harriet Butte, NP      . busPIRone (BUSPAR) tablet 5 mg  5 mg Oral TID Jenne Campus, MD   5 mg at 05/04/15 1214  . ferrous sulfate tablet 325 mg  325 mg Oral BID WC Derrill Center, NP   325 mg at 05/04/15 0914  . FLUoxetine (PROZAC) capsule 40 mg  40 mg Oral Daily Jenne Campus, MD   40 mg at 05/04/15 0915  . fluticasone (FLONASE) 50 MCG/ACT nasal spray 2 spray  2 spray Each Nare Daily  Bonnielee Haff, MD   2 spray at 05/04/15 0914  . hydrOXYzine (ATARAX/VISTARIL) tablet 25 mg  25 mg Oral TID PRN Harriet Butte, NP   25 mg at 05/01/15 2319  . hydrOXYzine (ATARAX/VISTARIL) tablet 50 mg  50 mg Oral QHS Derrill Center, NP   50 mg at 05/03/15 2116  . ibuprofen (ADVIL,MOTRIN) tablet 600 mg  600 mg Oral Q6H PRN Kerrie Buffalo, NP   600 mg at 05/03/15 2120  . magnesium hydroxide (MILK OF MAGNESIA) suspension 30 mL  30 mL Oral Daily PRN Harriet Butte, NP   30 mL at 05/04/15 0918  . nicotine (NICODERM CQ - dosed in mg/24 hours) patch 21 mg  21 mg Transdermal Daily Harriet Butte, NP   21 mg at 05/04/15 0915  . traZODone (DESYREL) tablet 25 mg  25 mg Oral QHS PRN Jenne Campus, MD   25 mg at 05/03/15 2302  . vitamin B-12 (CYANOCOBALAMIN) tablet 100 mcg  100 mcg Oral Daily Derrill Center, NP   100 mcg at 05/04/15 0915  . vitamin C (ASCORBIC ACID) tablet 500 mg  500 mg Oral Daily Derrill Center, NP   500 mg at 05/04/15 0915    Lab Results:  No results found for this or any previous visit (from the past 48 hour(s)).  Blood Alcohol level:  Lab Results  Component Value Date   [facility] <5 04/28/2015    Physical Findings: AIMS: Facial and Oral Movements Muscles of Facial Expression: None, normal Lips and Perioral Area: None, normal Jaw: None, normal Tongue: None, normal,Extremity Movements Upper (arms, wrists, hands, fingers): None, normal Lower (legs, knees, ankles, toes): None, normal, Trunk Movements Neck, shoulders, hips: None, normal, Overall Severity Severity of abnormal movements (highest score from questions above): None, normal Incapacitation due to abnormal movements: None, normal Patient's awareness of abnormal movements (rate only patient's report): No Awareness, Dental Status Current problems with teeth and/or dentures?: No Does patient usually wear dentures?: Yes  CIWA:  CIWA-Ar Total: 1 COWS:  COWS Total Score: 1  Musculoskeletal: Strength & Muscle Tone: within  normal limits Gait & Station: normal Patient leans: N/A  Psychiatric Specialty Exam: Review of Systems  Psychiatric/Behavioral: Positive for depression. Negative for suicidal ideas and hallucinations. The patient is nervous/anxious.   All other systems reviewed and are negative. reports ongoing nasal congestion   Blood pressure 97/66, pulse 59, temperature 98.5 F (36.9 C), temperature source Oral, resp. rate 16, height '5\' 7"'  (1.702 m), weight 174 lb (78.926 kg), last menstrual period 04/02/2015.Body mass index is 27.25 kg/(m^2).  General Appearance: grooming has improved compared to admission  Eye Contact::  Good   Speech:  Clear and Coherent  Volume:  Normal  Mood:  Depression gradually improving compared to admission  Affect:  Still constricted, but smiles at times appropriately  Thought Process:  Linear   Orientation:  Full (Time, Place, and Person)  Thought Content:  Rumination - no hallucinations, no delusions expressed - less somatically focused today  Suicidal Thoughts:  No denies any suicidal ideations, denies any self injurious ideations  Homicidal Thoughts:  No  Memory:  Recent and remote grossly intact   Judgement:  Improving   Insight:  Fair  Psychomotor Activity:  Improved   Concentration:  Good  Recall:  Good  Fund of Knowledge:Good  Language: Good  Akathisia:  No  Handed:  Right  AIMS (if indicated):     Assets:  Communication Skills Desire for Improvement Resilience Social Support  ADL's:  Intact  Cognition: WNL  Sleep:  Number of Hours: 5.5   Assessment- patient continues to present with some depression, but reports partial improvement compared to admission, and presents with improved range of affect and decreasing severity of  somatic ruminations. On Prozac and Buspar  Trials, denies side effects at this time. Has tolerated Prozac titration well .   Treatment Plan Summary: Daily contact with patient to assess and evaluate symptoms and progress in  treatment and Medication management Continue Prozac  40  mgs QAM  for depression, anxiety Continue Buspar 5 mgrs TID for anxiety, worry Continue  Vistaril 50 mg  QHS PRN  for insomnia Continue  vitamin B-12 , vit C ,and Iron supplementation Treatment team working on disposition planning   Neita Garnet, MD 05/04/2015, 4:12 PM

## 2015-05-05 MED ORDER — FERROUS SULFATE 325 (65 FE) MG PO TABS: 325.0000 mg | ORAL_TABLET | Freq: Two times a day (BID) | ORAL | Status: AC

## 2015-05-05 MED ORDER — BUSPIRONE HCL 5 MG PO TABS: 5.0000 mg | ORAL_TABLET | Freq: Three times a day (TID) | ORAL | Status: AC

## 2015-05-05 MED ORDER — NICOTINE 21 MG/24HR TD PT24: 21.0000 mg | MEDICATED_PATCH | Freq: Every day | TRANSDERMAL | Status: AC

## 2015-05-05 MED ORDER — FLUOXETINE HCL 40 MG PO CAPS: 40.0000 mg | ORAL_CAPSULE | Freq: Every day | ORAL | Status: AC

## 2015-05-05 MED ORDER — BENZONATATE 100 MG PO CAPS: 100.0000 mg | ORAL_CAPSULE | Freq: Two times a day (BID) | ORAL | Status: AC | PRN

## 2015-05-05 MED ORDER — TRAZODONE HCL 50 MG PO TABS: 25.0000 mg | ORAL_TABLET | Freq: Every evening | ORAL | Status: AC | PRN

## 2015-05-05 MED ORDER — ASCORBIC ACID 500 MG PO TABS: 500.0000 mg | ORAL_TABLET | Freq: Every day | ORAL | Status: AC

## 2015-05-05 MED ORDER — HYDROXYZINE HCL 25 MG PO TABS: 25.0000 mg | ORAL_TABLET | Freq: Three times a day (TID) | ORAL | Status: AC | PRN

## 2015-05-05 NOTE — [facility] Group Notes (Signed)
[facility] Group Notes:  (Nursing/MHT/Case Management/Adjunct)  Date:  05/05/2015  Time:  9:22 AM  Type of Therapy:  Nurse Education  Participation Level:  Did Not Attend  Pt was invited to attend but did not.   Melissa Noon M 05/05/2015, 9:22 AM

## 2015-05-05 NOTE — Tx Team (Signed)
Interdisciplinary Treatment Plan Update (Adult) Date: 05/05/2015   Date: 05/05/2015 10:05 AM  Progress in Treatment:  Attending groups: Yes  Participating in groups: Yes, minimally  Taking medication as prescribed: Yes  Tolerating medication: Yes  Family/Significant othe contact made: No, patient has declined collateral contact Patient understands diagnosis: Continuing to assess Discussing patient identified problems/goals with staff: Yes  Medical problems stabilized or resolved: Yes  Denies suicidal/homicidal ideation: Yes Patient has not harmed self or Others: Yes   New problem(s) identified: None identified at this time.   Discharge Plan or Barriers: Pt will return home and follow-up with outpatient provider  Additional comments:  Patient and CSW reviewed pt's identified goals and treatment plan. Patient verbalized understanding and agreed to treatment plan. CSW reviewed [facility] "Discharge Process and Patient Involvement" Form. Pt verbalized understanding of information provided and signed form.   Reason for Continuation of Hospitalization:  Anxiety Depression Medical Issues Medication stabilization Suicidal ideation Withdrawal symptoms Psychotic  Estimated length of stay: Discharge anticipated for today 05/05/15  Review of initial/current patient goals per problem list:   1.  Goal(s): Patient will participate in aftercare plan  Met:  Yes  Target date: 3-5 days from date of admission   As evidenced by: Patient will participate within aftercare plan AEB aftercare provider and housing plan at discharge being identified.   04/29/15: Pt will return home and follow-up with outpatient provider  2.  Goal (s): Patient will exhibit decreased depressive symptoms and suicidal ideations.  Met:  Adequate for discharge per MD  Target date: 3-5 days from date of admission   As evidenced by: Patient will utilize self rating of depression at 3 or below and demonstrate decreased signs of  depression or be deemed stable for discharge by MD.  04/29/15: Pt rates depression 0/10; denies SI  3/2: Adequate for discharge. Patient reports improvement in her depression levels and denies SI. She reports feeling safe to return home today.   Attendees:  Patient:    Family:    Physician: Dr. Parke Poisson, MD  05/05/2015 9:30 AM  Nursing: Loletta Specter, Saunders Glance., RN 05/05/2015 9:30 AM  Clinical Social Worker: Tilden Fossa, LCSW 05/05/2015 9:30 AM  Other:  05/05/2015 9:30 AM  Clinical: Agustina Caroli, May Augustin, NP 05/05/2015 9:30 AM  Other: Lars Pinks, RN Case manager  05/05/2015 9:30 AM           Tilden Fossa, LCSW Clinical Social Worker [facility] [phone]

## 2015-05-05 NOTE — Progress Notes (Signed)
Patient  discharged home with samples and prescriptions. Patient was stable and appreciative at that time. All papers and prescriptions were given and valuables returned. Verbal understanding expressed. Denies SI/HI and A/VH. Patient given opportunity to express concerns and ask questions.  

## 2015-05-05 NOTE — Progress Notes (Signed)
  [facility] Adult Case Management Discharge Plan :  Will you be returning to the same living situation after discharge:  Yes,  patient plans to return home At discharge, do you have transportation home?: Yes,  patient reports access to transportation Do you have the ability to pay for your medications: Yes,  patient will be provided with prescriptions at discharge  Release of information consent forms completed and in the chart;  Patient's signature needed at discharge.  Patient to Follow up at: Follow-up Information    [facility] up with [facility].   Specialty:  Professional Counselor   Why:  Walk-in clinic Monday-Friday between 8am to 3pm for assessment for therapy and medication management services. Please arrive early in the morning in order to be seen as quickly as possible.    Contact information:   [facility] [place] [address] [place] [address] [phone]       Follow up with [facility].   Why:  Call to schedule appointment for primary care services.    Contact information:   [address] [place] [address] [phone]      Next level of care provider has access to [facility] Link:no  Safety Planning and Suicide Prevention discussed: Yes,  with patient  Have you used any form of tobacco in the last 30 days? (Cigarettes, Smokeless Tobacco, Cigars, and/or Pipes): Yes  Has patient been referred to the Quitline?: Patient refused referral  Patient has been referred for addiction treatment: N/A  Simran Bomkamp, [name] 05/05/2015, 10:10 AM

## 2015-05-05 NOTE — [facility] Group Notes (Signed)
[facility] LCSW Group Therapy 05/05/2015 1:15 PM Type of Therapy: Group Therapy Participation Level: Active  Participation Quality: Attentive, Sharing and Supportive  Affect: Blunted  Cognitive: Alert and Oriented  Insight: Developing/Improving and Engaged  Engagement in Therapy: Developing/Improving and Engaged  Modes of Intervention: Activity, Clarification, Confrontation, Discussion, Education, Exploration, Limit-setting, Orientation, Problem-solving, Rapport Building, Reality Testing, Socialization and Support  Summary of Progress/Problems: Patient was attentive and engaged with speaker from [employer]. Patient was attentive to speaker while they shared their story of dealing with mental health and overcoming it. Patient expressed interest in their programs and services and received information on their agency. Patient processed ways they can relate to the speaker.   Cinderella Christoffersen, LCSW Clinical Social Worker [facility] [phone]   

## 2015-05-05 NOTE — [facility] Suicide Risk Assessment (Signed)
[facility] Discharge Suicide Risk Assessment   Principal Problem: MDD (major depressive disorder), recurrent episode, severe (HCC) Discharge Diagnoses:  Patient Active Problem List   Diagnosis Date Noted  . Severe episode of recurrent major depressive disorder, without psychotic features (HCC) [F33.2]   . IDA (iron deficiency anemia) [D50.9]   . MDD (major depressive disorder), recurrent episode, severe (HCC) [F33.2] 04/29/2015  . Microcytic anemia [D50.9] 04/29/2015  . Sinusitis, chronic [J32.9] 04/29/2015  . Hypokalemia [E87.6] 04/29/2015  . Depression with anxiety [F41.8] 04/22/2015    Total Time spent with patient: 30 minutes  Musculoskeletal: Strength & Muscle Tone: within normal limits Gait & Station: normal Patient leans: N/A   Psychiatric Specialty Exam: ROS denies headache, states she has partially improved nasal congestion and less sense of sinus pressure, no SOB, no fever   Blood pressure 101/65, pulse 67, temperature 98 F (36.7 C), temperature source Oral, resp. rate 16, height  (1.702 m), weight 174 lb (78.926 kg), last menstrual period 04/02/2015.Body mass index is 27.25 kg/(m^2).  General Appearance: improved grooming   Eye Contact::  Good  Speech:  Normal Rate409  Volume:  Normal  Mood:states she is less depressed, improved range of affect   Affect:  less constricted , smiles at times appropriately  Thought Process:  Linear  Orientation:  Full (Time, Place, and Person)  Thought Content:  denies hallucinations, no delusions- less somatically focused   Suicidal Thoughts:  No denies any suicidal ideations, denies any self injurious ideations   Homicidal Thoughts:  No  Memory:  recent and remote grossly intact   Judgement:  Other:  improved   Insight:  Fair  Psychomotor Activity:  Normal- less withdrawn  Concentration:  Good  Recall:  Good  Fund of Knowledge:Good  Language: Good  Akathisia:  Negative  Handed:  Right  AIMS (if indicated):     Assets:  Desire  for Improvement Resilience  Sleep:  Number of Hours: 6  Cognition: WNL  ADL's:  Intact   Mental Status Per Nursing Assessment::   On Admission:  NA  Demographic Factors:  45 year old single female, no children, employed, lives alone,   Loss Factors: Reports has been struggling with chronic nasal congestion and sinus symptoms. Limited support network   Historical Factors: History of depression, no prior psychiatric admissions, one prior suicide attempt as a teenager   Risk Reduction Factors:   Sense of responsibility to family and Employed  Continued Clinical Symptoms:  Alert and attentive, better groomed than on admission, mood partially improved, affect has been more reactive , less constricted, less intensely somatically focused, denies suicidal ideations, denies homicidal ideations, no psychotic symptoms , future oriented, wanting to return to work next week and planning on establishing PCP care . Denies medication side effects at this time  Cognitive Features That Contribute To Risk:  No gross cognitive deficits noted upon discharge. Is alert , attentive, and oriented x 3   Suicide Risk:  Mild:  Suicidal ideation of limited frequency, intensity, duration, and specificity.  There are no identifiable plans, no associated intent, mild dysphoria and related symptoms, good self-control (both objective and subjective assessment), few other risk factors, and identifiable protective factors, including available and accessible social support.  Follow-up Information    Follow up with [facility].   Specialty:  Professional Counselor   Why:  Walk-in clinic Monday-Friday between 8am to 3pm for assessment for therapy and medication management services. Please arrive early in the morning in  order to be seen as quickly as possible.    Contact information:   [facility] [place] [address] [place] [address] [phone]       Follow up  with [facility].   Why:  Call to schedule appointment for primary care services.    Contact information:   [address] [place] [address] [phone]      Plan Of Care/Follow-up recommendations:  Activity:  as tolerated  Diet:  Regular Tests:  NA Other:  See below  Patient plans to return home Follow up as above  Nehemiah Massed, MD 05/05/2015, 11:28 AM

## 2015-05-05 NOTE — Progress Notes (Signed)
D: Pt is alert and oriented x4. Pt endorses severe depression with some moderate stomach pain. She states, "I have to do everything alone. It is very hard; life is very hard for me." Pt denies VAH, anxiety, and SI/HI. She states, "I was a little anxious earlier, got some medicine; I feel much better now." Pt with flat affects remained calm and cooperative through the shift assessment.  A: Medications offered as prescribed.  Support, encouragement, and safe environment provided.  15-minute safety checks continue.  R: Pt was med compliant.  Pt attended wrap-up group. Safety checks continue.

## 2015-05-05 NOTE — Discharge Summary (Signed)
Physician Discharge Summary Note  Patient:  Jessica Shea is an 45 y.o., female MRN:  [id number] DOB:  01/16/1971 Patient phone:  [phone] (home)  Patient address:   [address] [place] [address],  Total Time spent with patient: 30 minutes  Date of Admission:  04/29/2015 Date of Discharge: 05/05/2015   Reason for Admission:  depression  Principal Problem: MDD (major depressive disorder), recurrent episode, severe [facility]) Discharge Diagnoses: Patient Active Problem List   Diagnosis Date Noted  . Severe episode of recurrent major depressive disorder, without psychotic features (HCC) [F33.2]   . IDA (iron deficiency anemia) [D50.9]   . MDD (major depressive disorder), recurrent episode, severe (HCC) [F33.2] 04/29/2015  . Microcytic anemia [D50.9] 04/29/2015  . Sinusitis, chronic [J32.9] 04/29/2015  . Hypokalemia [E87.6] 04/29/2015  . Depression with anxiety [F41.8] 04/22/2015    Past Psychiatric History: see above noted  Past Medical History:  Past Medical History  Diagnosis Date  . Thyroid disease   . Left thyroid nodule 2012  . Sinus bradycardia 2012  . Anemia   . Dyspnea 2012  . Sinusitis, acute ethmoidal    History reviewed. No pertinent past surgical history. Family History: History reviewed. No pertinent family history. Family Psychiatric  History:  Denied Social History:  History  Alcohol Use No     History  Drug Use No    Social History   Social History  . Marital Status: Single    Spouse Name: N/A  . Number of Children: N/A  . Years of Education: N/A   Social History Main Topics  . Smoking status: Current Every Day Smoker -- 0.25 packs/day    Types: Cigarettes  . Smokeless tobacco: None  . Alcohol Use: No  . Drug Use: No  . Sexual Activity: Yes    Birth Control/ Protection: None   Other Topics Concern  . None   Social History Narrative    Hospital Course:  Mike Rosier was admitted for MDD (major depressive disorder),  recurrent episode, severe (HCC) and crisis management.  She was treated with the following medications listed below.  Saraiah Bonanno was discharged with current medication and was instructed on how to take medications as prescribed; (details listed below under Medication List).  Medical problems were identified and treated as needed.  Home medications were restarted as appropriate.  Improvement was monitored by observation and Aftan Sedalia daily report of symptom reduction.  Emotional and mental status was monitored by daily self-inventory reports completed by Jerre Simon and clinical staff.         Jailyn Genna was evaluated by the treatment team for stability and plans for continued recovery upon discharge.  Elda Brunke motivation was an integral factor for scheduling further treatment.  Employment, transportation, bed availability, health status, family support, and any pending legal issues were also considered during her hospital stay.  She was offered further treatment options upon discharge including but not limited to Residential, Intensive Outpatient, and Outpatient treatment.  Bonnye Schiraldi will follow up with the services as listed below under Follow Up Information.     Upon completion of this admission the [facility] was both mentally and medically stable for discharge denying suicidal/homicidal ideation, auditory/visual/tactile hallucinations, delusional thoughts and paranoia.     Physical Findings: AIMS: Facial and Oral Movements Muscles of Facial Expression: None, normal Lips and Perioral Area: None, normal Jaw: None, normal Tongue: None, normal,Extremity Movements Upper (arms, wrists, hands, fingers): None, normal Lower (legs, knees, ankles, toes): None,  normal, Trunk Movements Neck, shoulders, hips: None, normal, Overall Severity Severity of abnormal movements (highest score from questions above): None, normal Incapacitation due to abnormal movements: None,  normal Patient's awareness of abnormal movements (rate only patient's report): No Awareness, Dental Status Current problems with teeth and/or dentures?: No Does patient usually wear dentures?: Yes  CIWA:  CIWA-Ar Total: 1 COWS:  COWS Total Score: 1  Musculoskeletal: Strength & Muscle Tone: within normal limits Gait & Station: normal Patient leans: N/A  Psychiatric Specialty Exam:  SEE MD SRA Review of Systems  Psychiatric/Behavioral: Negative for depression, suicidal ideas and hallucinations. The patient is not nervous/anxious.   All other systems reviewed and are negative.   Blood pressure 101/65, pulse 67, temperature 98 F (36.7 C), temperature source Oral, resp. rate 16, height 5\' 7"  (1.702 m), weight 78.926 kg (174 lb), last menstrual period 04/02/2015.Body mass index is 27.25 kg/(m^2).  Have you used any form of tobacco in the last 30 days? (Cigarettes, Smokeless Tobacco, Cigars, and/or Pipes): Yes  Has this patient used any form of tobacco in the last 30 days? (Cigarettes, Smokeless Tobacco, Cigars, and/or Pipes) Yes, Rx given  Blood Alcohol level:  Lab Results  Component Value Date   ETH <5 04/28/2015    Metabolic Disorder Labs:  Lab Results  Component Value Date   HGBA1C 5.20 04/22/2015   No results found for: PROLACTIN No results found for: CHOL, TRIG, HDL, CHOLHDL, VLDL, LDLCALC  See Psychiatric Specialty Exam and Suicide Risk Assessment completed by Attending Physician prior to discharge.  Discharge destination:  Home  Is patient on multiple antipsychotic therapies at discharge:  No   Has Patient had three or more failed trials of antipsychotic monotherapy by history:  No  Recommended Plan for Multiple Antipsychotic Therapies: NA     Medication List    STOP taking these medications        albuterol 108 (90 Base) MCG/ACT inhaler  Commonly known as:  PROVENTIL HFA;VENTOLIN HFA     amoxicillin 500 MG capsule  Commonly known as:  AMOXIL      amoxicillin-clavulanate 875-125 MG tablet  Commonly known as:  AUGMENTIN     FLUoxetine 20 MG tablet  Commonly known as:  PROZAC  Replaced by:  FLUoxetine 40 MG capsule     fluticasone 50 MCG/ACT nasal spray  Commonly known as:  FLONASE     HYDROcodone-homatropine 5-1.5 MG/5ML syrup  Commonly known as:  HYCODAN     LORazepam 1 MG tablet  Commonly known as:  ATIVAN     naproxen sodium 220 MG tablet  Commonly known as:  ANAPROX     predniSONE 20 MG tablet  Commonly known as:  DELTASONE     sucralfate 1 GM/10ML suspension  Commonly known as:  CARAFATE      TAKE these medications      Indication   ascorbic acid 500 MG tablet  Commonly known as:  VITAMIN C  Take 1 tablet (500 mg total) by mouth daily.   Indication:  Anemia     benzonatate 100 MG capsule  Commonly known as:  TESSALON  Take 1 capsule (100 mg total) by mouth 2 (two) times daily as needed for cough.   Indication:  Cough     busPIRone 5 MG tablet  Commonly known as:  BUSPAR  Take 1 tablet (5 mg total) by mouth 3 (three) times daily.   Indication:  Aggressive Behavior, Anxiety Disorder, Depression, Generalized Anxiety Disorder     ferrous sulfate  325 (65 FE) MG tablet  Take 1 tablet (325 mg total) by mouth 2 (two) times daily with a meal.   Indication:  Iron Deficiency     FLUoxetine 40 MG capsule  Commonly known as:  PROZAC  Take 1 capsule (40 mg total) by mouth daily.   Indication:  Major Depressive Disorder     hydrOXYzine 25 MG tablet  Commonly known as:  ATARAX/VISTARIL  Take 1 tablet (25 mg total) by mouth 3 (three) times daily as needed (sleep).   Indication:  Anxiety Neurosis, Sedation     nicotine 21 mg/24hr patch  Commonly known as:  NICODERM CQ - dosed in mg/24 hours  Place 1 patch (21 mg total) onto the skin daily.   Indication:  Nicotine Addiction     traZODone 50 MG tablet  Commonly known as:  DESYREL  Take 0.5 tablets (25 mg total) by mouth at bedtime as needed for sleep.    Indication:  Trouble Sleeping           Follow-up Information    Follow up with [facility].   Specialty:  Professional Counselor   Why:  Walk-in clinic Monday-Friday between 8am to 3pm for assessment for therapy and medication management services. Please arrive early in the morning in order to be seen as quickly as possible.    Contact information:   [facility] [place] [address] [place] [address] [phone]       Follow up with [facility].   Why:  Call to schedule appointment for primary care services.    Contact information:   [address] [place] [address] [phone]      Follow-up recommendations:  Activity:  as tol Diet:  as tol  Comments:  1.  Take all your medications as prescribed.              2.  Report any adverse side effects to outpatient provider.                       3.  Patient instructed to not use alcohol or illegal drugs while on prescription medicines.            4.  In the event of worsening symptoms, instructed patient to call 911, the crisis hotline or go to nearest emergency room for evaluation of symptoms.  Signed: Lindwood Qua, NP [facility] 05/05/2015, 10:58 AM  Patient seen, Suicide Assessment Completed.  Disposition Plan Reviewed

## 2015-05-06 ENCOUNTER — Ambulatory Visit (HOSPITAL_BASED_OUTPATIENT_CLINIC_OR_DEPARTMENT_OTHER): Payer: Self-pay | Admitting: Clinical

## 2015-05-06 ENCOUNTER — Ambulatory Visit: Payer: Self-pay | Attending: Family Medicine | Admitting: Family Medicine

## 2015-05-06 ENCOUNTER — Encounter: Payer: Self-pay | Admitting: Family Medicine

## 2015-05-06 VITALS — BP 118/52 | HR 58 | Temp 98.3°F | Resp 15 | Ht 68.0 in | Wt 181.0 lb

## 2015-05-06 DIAGNOSIS — F418 Other specified anxiety disorders: Secondary | ICD-10-CM

## 2015-05-06 DIAGNOSIS — T148 Other injury of unspecified body region: Secondary | ICD-10-CM

## 2015-05-06 DIAGNOSIS — F17210 Nicotine dependence, cigarettes, uncomplicated: Secondary | ICD-10-CM | POA: Insufficient documentation

## 2015-05-06 DIAGNOSIS — J329 Chronic sinusitis, unspecified: Secondary | ICD-10-CM | POA: Insufficient documentation

## 2015-05-06 DIAGNOSIS — J320 Chronic maxillary sinusitis: Secondary | ICD-10-CM

## 2015-05-06 DIAGNOSIS — M25562 Pain in left knee: Secondary | ICD-10-CM | POA: Insufficient documentation

## 2015-05-06 DIAGNOSIS — M1712 Unilateral primary osteoarthritis, left knee: Secondary | ICD-10-CM | POA: Insufficient documentation

## 2015-05-06 DIAGNOSIS — M79661 Pain in right lower leg: Secondary | ICD-10-CM | POA: Insufficient documentation

## 2015-05-06 DIAGNOSIS — T148XXA Other injury of unspecified body region, initial encounter: Secondary | ICD-10-CM

## 2015-05-06 DIAGNOSIS — Z79899 Other long term (current) drug therapy: Secondary | ICD-10-CM | POA: Insufficient documentation

## 2015-05-06 DIAGNOSIS — E079 Disorder of thyroid, unspecified: Secondary | ICD-10-CM | POA: Insufficient documentation

## 2015-05-06 MED ORDER — NAPROXEN 500 MG PO TABS: 500.0000 mg | ORAL_TABLET | Freq: Two times a day (BID) | ORAL | Status: DC

## 2015-05-06 MED ORDER — FLUTICASONE PROPIONATE 50 MCG/ACT NA SUSP: 1.0000 | Freq: Every day | NASAL | Status: AC

## 2015-05-06 MED ORDER — NAPROXEN 500 MG PO TABS: 500.0000 mg | ORAL_TABLET | Freq: Two times a day (BID) | ORAL | Status: AC

## 2015-05-06 MED ORDER — CETIRIZINE HCL 10 MG PO TABS: 10.0000 mg | ORAL_TABLET | Freq: Every day | ORAL | Status: DC

## 2015-05-06 MED ORDER — CETIRIZINE HCL 10 MG PO TABS: 10.0000 mg | ORAL_TABLET | Freq: Every day | ORAL | Status: AC

## 2015-05-06 MED ORDER — FLUTICASONE PROPIONATE 50 MCG/ACT NA SUSP: 1.0000 | Freq: Every day | NASAL | Status: DC

## 2015-05-06 NOTE — Progress Notes (Signed)
Patient here for f/u on sinus pressure She states it is no better She left AMA from behavioral health yesterday She will see Asher MuirJamie Asking for refill on Trazadone

## 2015-05-06 NOTE — Progress Notes (Signed)
Subjective:    Patient ID: Jessica Shea, female    DOB: [birth year], 45 y.o.   MRN: [id number]  HPI 45 year old female with a history of depression and anxiety with recent bouts of sinusitis (completed treatment with Augmentin initially and then with amoxicillin and prednisone at her last office visit). She complains that sinus symptoms are not any better despite the fact that she sounds and appears much better than she did at last office visit. She also has a right calf hematoma that she had complained was tender at the last visit but is not tender at this time.  She was admitted for major depressive disorder recurrent episode and crisis management at behavioral health between 04/29/15 and 05/05/15. She states depression symptoms are a lot better and she has prescriptions which she received at discharge and she is yet to pick up. Complains of left knee pain which is worse with walking but denies history of trauma and has no swelling.  Past Medical History  Diagnosis Date  . Thyroid disease   . Left thyroid nodule 2012  . Sinus bradycardia 2012  . Anemia   . Dyspnea 2012  . Sinusitis, acute ethmoidal     History reviewed. No pertinent past surgical history.  Social History   Social History  . Marital Status: Single    Spouse Name: N/A  . Number of Children: N/A  . Years of Education: N/A   Occupational History  . Not on file.   Social History Main Topics  . Smoking status: Current Every Day Smoker -- 0.25 packs/day    Types: Cigarettes  . Smokeless tobacco: Not on file  . Alcohol Use: No  . Drug Use: No  . Sexual Activity: Yes    Birth Control/ Protection: None   Other Topics Concern  . Not on file   Social History Narrative    Current Outpatient Prescriptions on File Prior to Visit  Medication Sig Dispense Refill  . busPIRone (BUSPAR) 5 MG tablet Take 1 tablet (5 mg total) by mouth 3 (three) times daily. 90 tablet 0  . FLUoxetine (PROZAC) 40 MG capsule Take 1  capsule (40 mg total) by mouth daily. 30 capsule 0  . hydrOXYzine (ATARAX/VISTARIL) 25 MG tablet Take 1 tablet (25 mg total) by mouth 3 (three) times daily as needed (sleep). 30 tablet 0  . benzonatate (TESSALON) 100 MG capsule Take 1 capsule (100 mg total) by mouth 2 (two) times daily as needed for cough. (Patient not taking: Reported on 05/06/2015) 20 capsule 0  . ferrous sulfate 325 (65 FE) MG tablet Take 1 tablet (325 mg total) by mouth 2 (two) times daily with a meal. (Patient not taking: Reported on 05/06/2015) 60 tablet 0  . nicotine (NICODERM CQ - DOSED IN MG/24 HOURS) 21 mg/24hr patch Place 1 patch (21 mg total) onto the skin daily. (Patient not taking: Reported on 05/06/2015) 28 patch 0  . traZODone (DESYREL) 50 MG tablet Take 0.5 tablets (25 mg total) by mouth at bedtime as needed for sleep. (Patient not taking: Reported on 05/06/2015) 30 tablet 0  . vitamin C (VITAMIN C) 500 MG tablet Take 1 tablet (500 mg total) by mouth daily. (Patient not taking: Reported on 05/06/2015) 30 tablet 0   No current facility-administered medications on file prior to visit.     Review of Systems  Constitutional: Negative for activity change, appetite change and fatigue.  HENT: Negative for congestion, sinus pressure and sore throat.   Eyes: Negative for visual disturbance.  Respiratory: Negative for cough, chest tightness, shortness of breath and wheezing.   Cardiovascular: Negative for chest pain and palpitations.  Gastrointestinal: Negative for abdominal pain, constipation and abdominal distention.  Endocrine: Negative for polydipsia.  Genitourinary: Negative for dysuria and frequency.  Musculoskeletal:       See history of present illness  Skin: Negative for rash.  Neurological: Negative for tremors, light-headedness and numbness.  Hematological: Does not bruise/bleed easily.  Psychiatric/Behavioral: Negative for behavioral problems and agitation.       Objective: Filed Vitals:   05/06/15 0910  BP:  118/52  Pulse: 58  Temp: 98.3 F (36.8 C)  Resp: 15  Height: 5\' 8"  (1.727 m)  Weight: 181 lb (82.101 kg)  SpO2: 100%      Physical Exam  Constitutional: She is oriented to person, place, and time. She appears well-developed and well-nourished.  Cardiovascular: Normal heart sounds and intact distal pulses.  Bradycardia present.   No murmur heard. Pulmonary/Chest: Effort normal and breath sounds normal. She has no wheezes. She has no rales. She exhibits no tenderness.  Abdominal: Soft. Bowel sounds are normal. She exhibits no distension and no mass. There is no tenderness.  Musculoskeletal: She exhibits edema (edema of the right calf with no tenderness) and tenderness (tenderness on range of motion of the left calf).  Neurological: She is alert and oriented to person, place, and time.          Assessment & Plan:  1. Chronic sinusitis- Completed course of treatment with amoxicillin and prednisone  fluticasone (FLONASE) 50 MCG/ACT nasal spray; Place 1 spray into both nostrils daily.  Dispense: 16 g; Refill: 0 Also placed on Zyrtec We'll reassess for improvement in next visit  2. Left knee pain/osteoarthritis Exam in keeping with early stages of osteoarthritis Placed on NSAIDs  3. Hematoma This could explain pain in her right calf Explained to the patient that this could take some time to resolve  4. Depression with anxiety Recently hospitalized for severe episode LCSW causing for counseling session. The patient received prescriptions on discharge but she is yet to fill them and has been encouraged to do so. Was to follow[facility]up with [facility] as per discharge note but patient states [facility].  This note has been created with Education officer, environmentalDragon speech recognition software and smart phrase technology. Any transcriptional errors are unintentional.

## 2015-05-06 NOTE — Patient Instructions (Signed)

## 2015-05-06 NOTE — Progress Notes (Signed)
ASSESSMENT: Pt currently experiencing symptoms of anxiety and depression, [facility]; would benefit from continued motivational interviewing regarding coping with symptoms of anxiety and depression.  Stage of Change: contemplative  PLAN: 1. F/U with behavioral health consultant in one month 2. Psychiatric Medications: Buspar, Prozac, Atarax 3. Behavioral recommendation(s):   -Continue with [facility] appointments/BH med management -Consider spending time sitting outside on warm days for at least 10 minutes SUBJECTIVE: Pt. referred by Dr Venetia NightAmao for symptoms of anxiety and depression:  Pt. reports the following symptoms/concerns: Pt states that she can feel a difference in how she is feeling now than at last visit, she [facility], and is feeling the new meds working. She still does not feel enough energy to go on walks, but thinks she may be able to sit outside. Pt not feeling any SI today, and is thankful to feel safe. She feels anxiety going down, as she is able to breathe easier. Duration of problem: One week Severity: severe  OBJECTIVE: Orientation & Cognition: Oriented x3. Thought processes normal and appropriate to situation. Mood: appropriate. Affect: appropriate Appearance: appropriate (visible change from last visit, mood lifted, smiling) Risk of harm to self or others: low risk of harm to self today, no SI today; no known risk of harm to others Substance use: tobacco Assessments administered: PHQ9: 23/ GAD7: 20   Diagnosis: Anxiety and depression.  CPT Code: F41.8 -------------------------------------------- Other(s) present in the room: none  Time spent with patient in exam room: 20 minutes, 9:10-9:30am   Depression screen [facility] 2/9 05/06/2015 04/22/2015 04/01/2015  Decreased Interest 3 3 3   Down, Depressed, Hopeless 3 3 3   PHQ - 2 Score 6 6 6   Altered sleeping 3 3 3   Tired, decreased energy 3 3 3   Change in appetite 3 3  3   Feeling bad or failure about yourself  3 3 3   Trouble concentrating 3 3 3   Moving slowly or fidgety/restless 2 3 3   Suicidal thoughts 0 3 3  PHQ-9 Score 23 27 27       GAD 7 : Generalized Anxiety Score 04/01/2015  Nervous, Anxious, on Edge 3  Control/stop worrying 3  Worry too much - different things 3  Trouble relaxing 3  Restless 3  Easily annoyed or irritable 3  Afraid - awful might happen 3  Total GAD 7 Score 21   05/06/15: GAD7: 20 (2,3,3,3,3,3,3)

## 2016-04-25 ENCOUNTER — Emergency Department (HOSPITAL_COMMUNITY)
Admission: EM | Admit: 2016-04-25 | Discharge: 2016-04-25 | Disposition: A | Discharge status: Home / Self Care | Payer: Managed Care, Other (non HMO) | Attending: Emergency Medicine | Admitting: Emergency Medicine

## 2016-04-25 ENCOUNTER — Encounter (HOSPITAL_COMMUNITY): Payer: Self-pay | Admitting: Emergency Medicine

## 2016-04-25 DIAGNOSIS — F17210 Nicotine dependence, cigarettes, uncomplicated: Secondary | ICD-10-CM | POA: Insufficient documentation

## 2016-04-25 DIAGNOSIS — R197 Diarrhea, unspecified: Secondary | ICD-10-CM | POA: Insufficient documentation

## 2016-04-25 DIAGNOSIS — T7840XA Allergy, unspecified, initial encounter: Secondary | ICD-10-CM | POA: Diagnosis not present

## 2016-04-25 HISTORY — DX: Anxiety disorder, unspecified: F41.9

## 2016-04-25 MED ORDER — DIPHENHYDRAMINE HCL 50 MG/ML IJ SOLN
25.0000 mg | Freq: Once | INTRAMUSCULAR | Status: AC
  Administered 2016-04-25: 25 mg via INTRAMUSCULAR
  Filled 2016-04-25: qty 1

## 2016-04-25 MED ORDER — FAMOTIDINE 20 MG PO TABS
20.0000 mg | ORAL_TABLET | Freq: Once | ORAL | Status: AC
  Administered 2016-04-25: 20 mg via ORAL
  Filled 2016-04-25: qty 1

## 2016-04-25 MED ORDER — FAMOTIDINE 20 MG PO TABS: 20.0000 mg | ORAL_TABLET | Freq: Two times a day (BID) | ORAL | 0 refills | Status: AC

## 2016-04-25 MED ORDER — DEXAMETHASONE SODIUM PHOSPHATE 10 MG/ML IJ SOLN: 10.0000 mg | Freq: Once | INTRAMUSCULAR | Status: DC

## 2016-04-25 MED ORDER — PREDNISONE 20 MG PO TABS
60.0000 mg | ORAL_TABLET | Freq: Once | ORAL | Status: AC
  Administered 2016-04-25: 60 mg via ORAL
  Filled 2016-04-25: qty 3

## 2016-04-25 MED ORDER — PREDNISONE 20 MG PO TABS: ORAL_TABLET | ORAL | 0 refills | Status: AC

## 2016-04-25 NOTE — ED Provider Notes (Signed)
[facility]-EMERGENCY DEPT Provider Note   CSN: [id number] Arrival date & time: 04/25/16  [id number]   By signing my name below, I, Jessica Shea, attest that this documentation has been prepared under the direction and in the presence of Jessica Mellott, MD  Electronically Signed: Clovis PuAvnee Shea, ED Scribe. 04/25/16. 3:43 AM.   History   Chief Complaint Chief Complaint  Patient presents with  . Allergic Reaction   The history is provided by the patient. No language interpreter was used.  Allergic Reaction  Presenting symptoms: itching, rash and swelling   Presenting symptoms: no wheezing   Presenting symptoms comment:  Not present at the time of exam Severity:  Moderate Prior allergic episodes:  No prior episodes Context comment:  Ate buffalo chicken.  but within minutes of eating also had diarrhea Relieved by:  Nothing Worsened by:  Nothing Ineffective treatments:  None tried  HPI Comments:  Jessica Shea is a 46 y.o. female who presents to the Emergency Department complaining of acute onset of an allergic reaction onset 12 AM. Pt states she was eating [place] chicken wings when her mouth began swelling and she had a rash and points to her upper arms. Pt also reports diarrhea within a minute or 2 of eating the chicken and thinks she has food poisoning. No alleviating factors noted. Pt denies any other associated symptoms.   Past Medical History:  Diagnosis Date  . Anemia   . Anxiety   . Dyspnea 2012  . Left thyroid nodule 2012  . Sinus bradycardia 2012  . Sinusitis, acute ethmoidal   . Thyroid disease     Patient Active Problem List   Diagnosis Date Noted  . Left knee pain 05/06/2015  . Hematoma 05/06/2015  . Severe episode of recurrent major depressive disorder, without psychotic features (HCC)   . IDA (iron deficiency anemia)   . MDD (major depressive disorder), recurrent episode, severe (HCC) 04/29/2015  . Microcytic anemia 04/29/2015  . Sinusitis, chronic 04/29/2015  .  Hypokalemia 04/29/2015  . Depression with anxiety 04/22/2015    History reviewed. No pertinent surgical history.  OB History    No data available       Home Medications    Prior to Admission medications   Medication Sig Start Date End Date Taking? Authorizing Provider  benzonatate (TESSALON) 100 MG capsule Take 1 capsule (100 mg total) by mouth 2 (two) times daily as needed for cough. Patient not taking: Reported on 05/06/2015 05/05/15   [name], NP  busPIRone (BUSPAR) 5 MG tablet Take 1 tablet (5 mg total) by mouth 3 (three) times daily. 05/05/15   [name], NP  cetirizine (ZYRTEC) 10 MG tablet Take 1 tablet (10 mg total) by mouth daily. 05/06/15   [name], MD  ferrous sulfate 325 (65 FE) MG tablet Take 1 tablet (325 mg total) by mouth 2 (two) times daily with a meal. Patient not taking: Reported on 05/06/2015 05/05/15   [name], NP  FLUoxetine (PROZAC) 40 MG capsule Take 1 capsule (40 mg total) by mouth daily. 05/05/15   [name], NP  fluticasone (FLONASE) 50 MCG/ACT nasal spray Place 1 spray into both nostrils daily. 05/06/15   [name], MD  hydrOXYzine (ATARAX/VISTARIL) 25 MG tablet Take 1 tablet (25 mg total) by mouth 3 (three) times daily as needed (sleep). 05/05/15   [name], NP  naproxen (NAPROSYN) 500 MG tablet Take 1 tablet (500 mg total) by mouth 2 (two) times daily with a meal. 05/06/15   [name], MD  nicotine (NICODERM CQ - DOSED IN MG/24 HOURS) 21 mg/24hr patch Place 1 patch (21 mg total) onto the skin daily. Patient not taking: Reported on 05/06/2015 05/05/15   Adonis Brook, NP  traZODone (DESYREL) 50 MG tablet Take 0.5 tablets (25 mg total) by mouth at bedtime as needed for sleep. Patient not taking: Reported on 05/06/2015 05/05/15   Adonis Brook, NP  vitamin C (VITAMIN C) 500 MG tablet Take 1 tablet (500 mg total) by mouth daily. Patient not taking: Reported on 05/06/2015 05/05/15   Adonis Brook, NP    Family History History reviewed. No  pertinent family history.  Social History Social History  Substance Use Topics  . Smoking status: Current Some Day Smoker    Packs/day: 0.25    Types: Cigarettes  . Smokeless tobacco: Never Used  . Alcohol use No     Allergies   Patient has no known allergies.   Review of Systems Review of Systems  Constitutional: Negative for chills and diaphoresis.  HENT: Negative for congestion and facial swelling.        -oral swelling   Respiratory: Negative for wheezing and stridor.   Cardiovascular: Negative for chest pain, palpitations and leg swelling.  Gastrointestinal: Positive for diarrhea. Negative for abdominal pain.  Skin: Positive for itching and rash.  All other systems reviewed and are negative.  Physical Exam Updated Vital Signs BP 140/70 (BP Location: Left Arm)   Pulse (!) 56   Temp 97.7 F (36.5 C) (Oral)   Resp 20   LMP 04/01/2016 (Approximate)   SpO2 100%   Physical Exam  Constitutional: She is oriented to person, place, and time. She appears well-developed and well-nourished. No distress.  HENT:  Head: Normocephalic and atraumatic.  Mouth/Throat: Uvula is midline and oropharynx is clear and moist. No uvula swelling. No oropharyngeal exudate.  Moist mucous membranes. No swelling of lips, tongue or uvula   Eyes: Conjunctivae are normal. Pupils are equal, round, and reactive to light.  Neck: Normal range of motion. Neck supple. No JVD present. No tracheal deviation present.  Trachea midline No bruit Intact phonation   Cardiovascular: Normal rate, regular rhythm, normal heart sounds and intact distal pulses.   Pulmonary/Chest: Effort normal and breath sounds normal. No stridor. No respiratory distress. She has no wheezes.  Abdominal: Soft. Bowel sounds are normal. She exhibits no distension and no mass. There is no tenderness. There is no rebound and no guarding.  Hyperactive bowel sounds   Musculoskeletal: Normal range of motion.  Lymphadenopathy:    She has  no cervical adenopathy.  Neurological: She is alert and oriented to person, place, and time. She has normal reflexes. She displays normal reflexes.  Skin: Skin is warm and dry. Capillary refill takes less than 2 seconds.  Psychiatric: She has a normal mood and affect. Her behavior is normal.  Nursing note and vitals reviewed.  ED Treatments / Results   Vitals:   04/25/16 0133 04/25/16 0405  BP: 140/70 121/71  Pulse: (!) 56 65  Resp: 20 18  Temp: 97.7 F (36.5 C)     DIAGNOSTIC STUDIES: Oxygen Saturation is 100% on RA, normal by my interpretation.    COORDINATION OF CARE: 3:43 AM Discussed treatment plan with pt at bedside and pt agreed to plan.   Procedures Procedures (including critical care time)  Medications Ordered in ED  Medications  diphenhydrAMINE (BENADRYL) injection 25 mg (25 mg Intramuscular Given 04/25/16 0452)  famotidine (PEPCID) tablet 20 mg (20 mg  Oral Given 04/25/16 0452)     Final Clinical Impressions(s) / ED Diagnoses  Allergic reaction:  No signs on exam.  Will treat in the ED. Continue meds previously prescribed. Return for facial swelling, shortness of breath, intractable pain or vomiting/diarrhea or any concerns.  The patient appears reasonably screened and/or stabilized for discharge and I doubt any other medical condition or other [facility] requiring further screening, evaluation, or treatment in the ED at this time prior to discharge.   New Prescriptions New Prescriptions   No medications on file   I personally performed the services described in this documentation, which was scribed in my presence. The recorded information has been reviewed and is accurate.       Cy Blamer, MD 04/25/16 [phone]

## 2016-04-25 NOTE — ED Triage Notes (Signed)
Pt states about midnight she ate some chicken wings with hot sauce on them and shortly after she felt like her mouth was swelling and she became short of breath and passed out  Pt states since that she has had abdominal cramping, diarrhea, and nausea  Pt states she still feels short of breath   Pt has hx of anxiety

## 2016-06-01 ENCOUNTER — Other Ambulatory Visit: Payer: Self-pay | Admitting: Family Medicine

## 2023-02-14 ENCOUNTER — Other Ambulatory Visit (HOSPITAL_COMMUNITY): Payer: Self-pay

## 2023-02-14 MED ORDER — SEMAGLUTIDE-WEIGHT MANAGEMENT 1 MG/0.5ML ~~LOC~~ SOAJ
1.0000 mg | SUBCUTANEOUS | 0 refills | Status: AC
  Filled 2023-02-14: qty 2, 28d supply, fill #0

## 2024-02-07 ENCOUNTER — Other Ambulatory Visit: Payer: Self-pay | Admitting: Physician Assistant

## 2024-02-07 DIAGNOSIS — Z1231 Encounter for screening mammogram for malignant neoplasm of breast: Secondary | ICD-10-CM
